# Patient Record
Sex: Female | Born: 1960 | Race: Black or African American | Hispanic: No | Marital: Married | Smoking: Never smoker
Health system: Southern US, Community
[De-identification: ages and names within clinical notes are randomized; demographics above are authoritative.]

## PROBLEM LIST (undated history)

## (undated) DIAGNOSIS — K219 Gastro-esophageal reflux disease without esophagitis: Secondary | ICD-10-CM

## (undated) DIAGNOSIS — M5030 Other cervical disc degeneration, unspecified cervical region: Secondary | ICD-10-CM

## (undated) DIAGNOSIS — R42 Dizziness and giddiness: Secondary | ICD-10-CM

## (undated) DIAGNOSIS — I10 Essential (primary) hypertension: Secondary | ICD-10-CM

## (undated) DIAGNOSIS — J309 Allergic rhinitis, unspecified: Secondary | ICD-10-CM

## (undated) DIAGNOSIS — J069 Acute upper respiratory infection, unspecified: Secondary | ICD-10-CM

## (undated) DIAGNOSIS — R7303 Prediabetes: Secondary | ICD-10-CM

## (undated) DIAGNOSIS — R319 Hematuria, unspecified: Secondary | ICD-10-CM

## (undated) DIAGNOSIS — I773 Arterial fibromuscular dysplasia: Secondary | ICD-10-CM

## (undated) DIAGNOSIS — R55 Syncope and collapse: Secondary | ICD-10-CM

## (undated) DIAGNOSIS — E049 Nontoxic goiter, unspecified: Secondary | ICD-10-CM

## (undated) DIAGNOSIS — H905 Unspecified sensorineural hearing loss: Secondary | ICD-10-CM

## (undated) HISTORY — DX: Other cervical disc degeneration, unspecified cervical region: M50.30

## (undated) HISTORY — DX: Essential (primary) hypertension: I10

## (undated) HISTORY — DX: Prediabetes: R73.03

## (undated) HISTORY — DX: Acute upper respiratory infection, unspecified: J06.9

## (undated) HISTORY — DX: Gastro-esophageal reflux disease without esophagitis: K21.9

## (undated) HISTORY — DX: Syncope and collapse: R55

## (undated) HISTORY — PX: CHOLECYSTECTOMY: SHX55

## (undated) HISTORY — DX: Allergic rhinitis, unspecified: J30.9

## (undated) HISTORY — DX: Unspecified sensorineural hearing loss: H90.5

## (undated) HISTORY — DX: Nontoxic goiter, unspecified: E04.9

## (undated) HISTORY — DX: Arterial fibromuscular dysplasia: I77.3

## (undated) HISTORY — DX: Hematuria, unspecified: R31.9

## (undated) HISTORY — DX: Dizziness and giddiness: R42

## (undated) HISTORY — PX: ESOPHAGOGASTRODUODENOSCOPY: SHX1529

---

## 2004-04-05 HISTORY — PX: TOTAL ABDOMINAL HYSTERECTOMY: SHX209

## 2004-05-08 ENCOUNTER — Ambulatory Visit: Payer: Self-pay | Admitting: Unknown Physician Specialty

## 2004-05-26 ENCOUNTER — Ambulatory Visit: Payer: Self-pay | Admitting: Unknown Physician Specialty

## 2004-08-25 ENCOUNTER — Ambulatory Visit: Payer: Self-pay | Admitting: Unknown Physician Specialty

## 2005-11-02 ENCOUNTER — Ambulatory Visit: Payer: Self-pay | Admitting: Unknown Physician Specialty

## 2006-03-07 ENCOUNTER — Emergency Department: Payer: Self-pay | Admitting: Emergency Medicine

## 2006-03-10 ENCOUNTER — Ambulatory Visit: Payer: Self-pay | Admitting: Family Medicine

## 2007-01-05 ENCOUNTER — Ambulatory Visit: Payer: Self-pay | Admitting: Unknown Physician Specialty

## 2008-01-16 ENCOUNTER — Ambulatory Visit: Payer: Self-pay | Admitting: Unknown Physician Specialty

## 2009-01-21 ENCOUNTER — Ambulatory Visit: Payer: Self-pay | Admitting: Unknown Physician Specialty

## 2009-03-18 ENCOUNTER — Ambulatory Visit: Payer: Self-pay | Admitting: Gastroenterology

## 2009-07-10 ENCOUNTER — Ambulatory Visit: Payer: Self-pay | Admitting: Unknown Physician Specialty

## 2010-01-05 ENCOUNTER — Ambulatory Visit: Payer: Self-pay | Admitting: Unknown Physician Specialty

## 2010-01-22 ENCOUNTER — Ambulatory Visit: Payer: Self-pay | Admitting: Unknown Physician Specialty

## 2010-11-26 ENCOUNTER — Ambulatory Visit: Payer: Self-pay | Admitting: Unknown Physician Specialty

## 2010-12-06 ENCOUNTER — Ambulatory Visit: Payer: Self-pay | Admitting: Unknown Physician Specialty

## 2011-02-03 ENCOUNTER — Ambulatory Visit: Payer: Self-pay | Admitting: Unknown Physician Specialty

## 2011-04-06 HISTORY — PX: COLONOSCOPY: SHX174

## 2011-05-13 ENCOUNTER — Emergency Department (HOSPITAL_COMMUNITY)
Admission: EM | Admit: 2011-05-13 | Discharge: 2011-05-13 | Disposition: A | Discharge status: Home / Self Care | Payer: 59 | Attending: Emergency Medicine | Admitting: Emergency Medicine

## 2011-05-13 ENCOUNTER — Encounter (HOSPITAL_COMMUNITY): Payer: Self-pay | Admitting: Emergency Medicine

## 2011-05-13 DIAGNOSIS — R42 Dizziness and giddiness: Secondary | ICD-10-CM | POA: Insufficient documentation

## 2011-05-13 DIAGNOSIS — R109 Unspecified abdominal pain: Secondary | ICD-10-CM | POA: Insufficient documentation

## 2011-05-13 NOTE — ED Notes (Signed)
Dizziness x 4 months states it is before bm that this happenes and then she feels better after bm has seen a dr for this  Has a gi dr last seen 2 weeks ago  Has f/u 2/14 and blood work ok  Has seen a neuro dr in [place] has had an EEg yesterday States still dizzy

## 2011-05-13 NOTE — ED Provider Notes (Signed)
Medical screening examination/treatment/procedure(s) were conducted as a shared visit with non-physician practitioner(s) and myself.  I personally evaluated the patient during the encounter   Benny Lennert, MD 05/13/11 1524

## 2011-05-13 NOTE — ED Notes (Signed)
Pt not in room presently  

## 2011-05-13 NOTE — ED Notes (Signed)
Patient discharged home, steady gait, denies pain. Verbalized understanding of instructions and followup

## 2011-05-13 NOTE — ED Notes (Signed)
Pt brought back to room 7

## 2011-05-13 NOTE — ED Provider Notes (Signed)
History     CSN: [id number]  Arrival date & time 05/13/11  1029   First MD Initiated Contact with Patient 05/13/11 1116      Chief Complaint  Patient presents with  . Dizziness    (Consider location/radiation/quality/duration/timing/severity/associated sxs/prior treatment) Patient is a 51 y.o. female presenting with cramps. The history is provided by the patient.  Abdominal Cramping The primary symptoms of the illness include abdominal pain. The primary symptoms of the illness do not include fever or vomiting.  Symptoms associated with the illness do not include chills. Associated symptoms comments: She reports symptoms x 4 months of lightheadedness associated with need for a bowel movement. When she feels cramping that she states feels like the need to have a bowel movement, she becomes lightheaded without syncope. She has been seen by her primary care doctor in [place]. She reports negative outpatient studies by gastroenterology and primary care including head CT, carotid dopplers, nerve conduction studies and an EEG that was performed yesterday but that she has not gotten the result for as yet. She is asymptomatic at this time. Marland Kitchen    History reviewed. No pertinent past medical history.  History reviewed. No pertinent past surgical history.  History reviewed. No pertinent family history.  History  Substance Use Topics  . Smoking status: Never Smoker   . Smokeless tobacco: Not on file  . Alcohol Use: No    OB History    Grav Para Term Preterm Abortions TAB SAB Ect Mult Living                  Review of Systems  Constitutional: Negative for fever and chills.  HENT: Negative.   Respiratory: Negative.   Cardiovascular: Negative.   Gastrointestinal: Positive for abdominal pain. Negative for vomiting and blood in stool.  Musculoskeletal: Negative.   Skin: Negative.   Neurological: Positive for light-headedness.    Allergies  Sulfa antibiotics  Home Medications    Current Outpatient Rx  Name Route Sig Dispense Refill  . ALLEGRA PO Oral Take 1 tablet by mouth daily as needed. For allergies    . PANTOPRAZOLE SODIUM 40 MG PO TBEC Oral Take 40 mg by mouth daily.      BP 143/74  Pulse 84  Temp(Src) 98.1 F (36.7 C) (Oral)  Resp 20  Ht 5\' 2"  (1.575 m)  Wt 140 lb (63.504 kg)  BMI 25.61 kg/m2  SpO2 100%  Physical Exam  Constitutional: She appears well-developed and well-nourished.  HENT:  Head: Normocephalic.  Neck: Normal range of motion. Neck supple.  Cardiovascular: Normal rate and regular rhythm.   Pulmonary/Chest: Effort normal and breath sounds normal.  Abdominal: Soft. Bowel sounds are normal. There is no tenderness. There is no rebound and no guarding.  Musculoskeletal: Normal range of motion.  Neurological: She is alert. No cranial nerve deficit.  Skin: Skin is warm and dry. No rash noted.  Psychiatric: She has a normal mood and affect.    ED Course  Procedures (including critical care time)  Labs Reviewed - No data to display No results found.   No diagnosis found.    MDM  Patient brings results of mutliple studies with her to ED. Discussed that, with her symptoms having resolved and outpatient follow up securely in place that further answers or diagnosis was unlikely in the emergency setting. Patient acknowledges understanding. All questions answered.        Rodena Medin, PA-C 05/13/11 1343

## 2011-05-15 ENCOUNTER — Emergency Department: Payer: Self-pay | Admitting: Emergency Medicine

## 2011-05-15 LAB — COMPREHENSIVE METABOLIC PANEL
Alkaline Phosphatase: 70 U/L (ref 50–136)
Calcium, Total: 9 mg/dL (ref 8.5–10.1)
Chloride: 109 mmol/L — ABNORMAL HIGH (ref 98–107)
Co2: 29 mmol/L (ref 21–32)
EGFR (African American): >60
EGFR (Non-African Amer.): >60
Osmolality: 294 (ref 275–301)
Sodium: 147 mmol/L — ABNORMAL HIGH (ref 136–145)

## 2011-05-15 LAB — CBC
MCH: 30.7 pg (ref 26.0–34.0)
MCHC: 33.4 g/dL (ref 32.0–36.0)
MCV: 92 fL (ref 80–100)
Platelet: 254 10*3/uL (ref 150–440)
RBC: 4.55 10*6/uL (ref 3.80–5.20)

## 2011-06-04 ENCOUNTER — Ambulatory Visit: Payer: Self-pay | Admitting: Internal Medicine

## 2011-06-24 DIAGNOSIS — R42 Dizziness and giddiness: Secondary | ICD-10-CM

## 2011-06-24 DIAGNOSIS — R1013 Epigastric pain: Secondary | ICD-10-CM | POA: Insufficient documentation

## 2011-06-24 HISTORY — DX: Dizziness and giddiness: R42

## 2011-07-05 DIAGNOSIS — J069 Acute upper respiratory infection, unspecified: Secondary | ICD-10-CM

## 2011-07-05 DIAGNOSIS — J309 Allergic rhinitis, unspecified: Secondary | ICD-10-CM | POA: Insufficient documentation

## 2011-07-05 HISTORY — DX: Acute upper respiratory infection, unspecified: J06.9

## 2011-07-05 HISTORY — DX: Allergic rhinitis, unspecified: J30.9

## 2011-08-16 ENCOUNTER — Ambulatory Visit: Payer: Self-pay | Admitting: Gastroenterology

## 2011-08-19 LAB — PATHOLOGY REPORT

## 2012-01-05 DIAGNOSIS — H905 Unspecified sensorineural hearing loss: Secondary | ICD-10-CM

## 2012-01-05 HISTORY — DX: Unspecified sensorineural hearing loss: H90.5

## 2012-04-05 HISTORY — PX: COLONOSCOPY: SHX174

## 2012-05-09 DIAGNOSIS — E559 Vitamin D deficiency, unspecified: Secondary | ICD-10-CM | POA: Insufficient documentation

## 2012-05-09 DIAGNOSIS — R7301 Impaired fasting glucose: Secondary | ICD-10-CM | POA: Insufficient documentation

## 2012-05-09 DIAGNOSIS — M5030 Other cervical disc degeneration, unspecified cervical region: Secondary | ICD-10-CM | POA: Insufficient documentation

## 2012-05-09 DIAGNOSIS — E785 Hyperlipidemia, unspecified: Secondary | ICD-10-CM | POA: Insufficient documentation

## 2012-05-09 HISTORY — DX: Other cervical disc degeneration, unspecified cervical region: M50.30

## 2012-05-22 ENCOUNTER — Encounter (HOSPITAL_COMMUNITY): Payer: Self-pay | Admitting: Emergency Medicine

## 2012-05-22 ENCOUNTER — Emergency Department (HOSPITAL_COMMUNITY)
Admission: EM | Admit: 2012-05-22 | Discharge: 2012-05-22 | Disposition: A | Discharge status: Home / Self Care | Payer: 59 | Attending: Emergency Medicine | Admitting: Emergency Medicine

## 2012-05-22 ENCOUNTER — Emergency Department (INDEPENDENT_AMBULATORY_CARE_PROVIDER_SITE_OTHER)
Admission: EM | Admit: 2012-05-22 | Discharge: 2012-05-22 | Disposition: A | Discharge status: Nursing Home (Includes ICF) | Payer: 59 | Source: Home / Self Care | Attending: Emergency Medicine | Admitting: Emergency Medicine

## 2012-05-22 DIAGNOSIS — Z79899 Other long term (current) drug therapy: Secondary | ICD-10-CM | POA: Insufficient documentation

## 2012-05-22 DIAGNOSIS — R55 Syncope and collapse: Secondary | ICD-10-CM | POA: Insufficient documentation

## 2012-05-22 DIAGNOSIS — R42 Dizziness and giddiness: Secondary | ICD-10-CM

## 2012-05-22 DIAGNOSIS — R209 Unspecified disturbances of skin sensation: Secondary | ICD-10-CM | POA: Insufficient documentation

## 2012-05-22 LAB — COMPREHENSIVE METABOLIC PANEL
ALT: 9 U/L (ref 0–35)
Albumin: 3.7 g/dL (ref 3.5–5.2)
Alkaline Phosphatase: 78 U/L (ref 39–117)
Chloride: 106 mEq/L (ref 96–112)
Glucose, Bld: 120 mg/dL — ABNORMAL HIGH (ref 70–99)
Potassium: 3.6 mEq/L (ref 3.5–5.1)
Sodium: 143 mEq/L (ref 135–145)
Total Protein: 6.8 g/dL (ref 6.0–8.3)

## 2012-05-22 LAB — CBC WITH DIFFERENTIAL/PLATELET
Basophils Relative: 0 % (ref 0–1)
Eosinophils Absolute: 0.1 10*3/uL (ref 0.0–0.7)
Lymphs Abs: 1.9 10*3/uL (ref 0.7–4.0)
MCH: 30.6 pg (ref 26.0–34.0)
Neutro Abs: 4.8 10*3/uL (ref 1.7–7.7)
Neutrophils Relative %: 66 % (ref 43–77)
Platelets: 244 10*3/uL (ref 150–400)
RBC: 4.48 MIL/uL (ref 3.87–5.11)

## 2012-05-22 MED ORDER — SODIUM CHLORIDE 0.9 % IV BOLUS (SEPSIS): 1000.0000 mL | Freq: Once | INTRAVENOUS | Status: DC

## 2012-05-22 NOTE — ED Notes (Signed)
Dizziness started 1 year getting worse since Thursday and saw ucc today and was sent for further tests

## 2012-05-22 NOTE — ED Provider Notes (Signed)
Medical screening examination/treatment/procedure(s) were performed by non-physician practitioner and as supervising physician I was immediately available for consultation/collaboration.  Ollin Hochmuth, M.D.  Alexzavier Girardin C Valor Quaintance, MD 05/22/12 2139 

## 2012-05-22 NOTE — ED Provider Notes (Signed)
History     CSN: [id number]  Arrival date & time 05/22/12  1027   First MD Initiated Contact with Patient 05/22/12 1053      Chief Complaint  Patient presents with  . Dizziness    (Consider location/radiation/quality/duration/timing/severity/associated sxs/prior treatment) Patient is a 52 y.o. female presenting with weakness. The history is provided by the patient. No language interpreter was used.  Weakness This is a new problem. The current episode started 3 to 5 hours ago. The problem occurs constantly. Nothing aggravates the symptoms. Nothing relieves the symptoms. She has tried nothing for the symptoms.  Pt reports she has been diagnosed with vasovagal syncope.   Pt had an episode today.   Pt reports she has tingling in her nose, face and feels weak in her right arm and right leg.  Pt reports multiple similar episodes but they are becoming worse.   Pt is followed by [facility] neurology.    History reviewed. No pertinent past medical history.  History reviewed. No pertinent past surgical history.  No family history on file.  History  Substance Use Topics  . Smoking status: Never Smoker   . Smokeless tobacco: Not on file  . Alcohol Use: No    OB History   Grav Para Term Preterm Abortions TAB SAB Ect Mult Living                  Review of Systems  Neurological: Positive for weakness.  All other systems reviewed and are negative.    Allergies  Sulfa antibiotics  Home Medications   Current Outpatient Rx  Name  Route  Sig  Dispense  Refill  . pantoprazole (PROTONIX) 40 MG tablet   Oral   Take 40 mg by mouth daily.         Marland Kitchen Fexofenadine HCl (ALLEGRA PO)   Oral   Take 1 tablet by mouth daily as needed. For allergies           BP 130/77  Pulse 62  Temp(Src) 98.4 F (36.9 C) (Oral)  Resp 20  SpO2 99%  Physical Exam  Nursing note and vitals reviewed. Constitutional: She is oriented to person, place, and time. She appears well-developed and  well-nourished.  HENT:  Head: Normocephalic and atraumatic.  Right Ear: External ear normal.  Left Ear: External ear normal.  Mouth/Throat: Oropharynx is clear and moist.  Eyes: Conjunctivae and EOM are normal. Pupils are equal, round, and reactive to light.  Neck: Normal range of motion. Neck supple.  Cardiovascular: Normal rate.   Pulmonary/Chest: Effort normal.  Abdominal: Soft.  Musculoskeletal: Normal range of motion.  Neurological: She is alert and oriented to person, place, and time. A cranial nerve deficit is present. Coordination normal.  Skin: Skin is warm.  Psychiatric: She has a normal mood and affect.    ED Course  Procedures (including critical care time)  Labs Reviewed - No data to display No results found.   No diagnosis found.    MDM  Pt to ED for evaluation.       Lonia Skinner Pole [place], [place] 05/22/12 1144

## 2012-05-22 NOTE — ED Notes (Signed)
Pt refused IV and fluid because she thinks it's not necessary.

## 2012-05-22 NOTE — ED Notes (Signed)
Pt c/o dizziness x1 year and c/o "blacking out" last Thursday Saw her neurologist on 04/07/12 and has a f/u appt this Friday Reports shoveling snow last Thursday and started to "black out" Sx included: fatigue and feeling weakness; slurry speech, tingly sensation on cheeks; episode lasted for 40 minutes Neurologist dx her w/vasovagul States that sx are progressively getting worse Also states that while driving to work today, started to black out.   She is alert w/no signs of acute distress.

## 2012-05-22 NOTE — ED Provider Notes (Signed)
History     CSN: [id number]  Arrival date & time 05/22/12  1206   First MD Initiated Contact with Patient 05/22/12 1244      Chief Complaint  Patient presents with  . Dizziness    (Consider location/radiation/quality/duration/timing/severity/associated sxs/prior treatment) The history is provided by the patient.  Karen Maldonado is a 52 y.o. female history of vasovagal syncope here with presyncope. She was driving this morning and felt like she was going to pass out but didn't pass out. Denies any antecedent chest pain or shortness of breath. She has been follow back at [facility] neuro clinic and had extensive work up including nl EEG, video EEG, stress test, echo. She was diagnosed with vasovagal syncope. Went to urgent care today and sent for eval. She has some tingling in her R arm and leg that is chronic. No new weakness.    History reviewed. No pertinent past medical history.  History reviewed. No pertinent past surgical history.  No family history on file.  History  Substance Use Topics  . Smoking status: Never Smoker   . Smokeless tobacco: Not on file  . Alcohol Use: No    OB History   Grav Para Term Preterm Abortions TAB SAB Ect Mult Living                  Review of Systems  Neurological: Positive for dizziness.  All other systems reviewed and are negative.    Allergies  Sulfa antibiotics  Home Medications   Current Outpatient Rx  Name  Route  Sig  Dispense  Refill  . meclizine (ANTIVERT) 25 MG tablet   Oral   Take 25 mg by mouth 3 (three) times daily as needed for dizziness.         . pantoprazole (PROTONIX) 40 MG tablet   Oral   Take 40 mg by mouth daily.         Marland Kitchen Fexofenadine HCl (ALLEGRA PO)   Oral   Take 1 tablet by mouth daily as needed. For allergies           BP 147/85  Pulse 66  Temp(Src) 98.3 F (36.8 C)  Resp 16  SpO2 100%  Physical Exam  Nursing note and vitals reviewed. Constitutional: She is oriented to  person, place, and time. She appears well-developed and well-nourished.  HENT:  Head: Normocephalic.  Mouth/Throat: Oropharynx is clear and moist.  Eyes: Conjunctivae are normal. Pupils are equal, round, and reactive to light.  Neck: Normal range of motion. Neck supple.  Cardiovascular: Normal rate, regular rhythm and normal heart sounds.   Pulmonary/Chest: Effort normal and breath sounds normal. No respiratory distress. She has no wheezes. She has no rales.  Abdominal: Soft. Bowel sounds are normal. She exhibits no distension. There is no tenderness. There is no rebound.  Musculoskeletal: Normal range of motion.  Neurological: She is alert and oriented to person, place, and time.  Nl strength and sensation throughout. Nl gait. No pronator drift.   Skin: Skin is warm and dry.  Psychiatric: She has a normal mood and affect. Her behavior is normal. Judgment and thought content normal.    ED Course  Procedures (including critical care time)  Labs Reviewed  COMPREHENSIVE METABOLIC PANEL - Abnormal; Notable for the following:    Glucose, Bld 120 (*)    Total Bilirubin 0.2 (*)    All other components within normal limits  CBC WITH DIFFERENTIAL  TROPONIN I   No results found.  No diagnosis found.   Date: 05/22/2012  Rate: 71  Rhythm: normal sinus rhythm  QRS Axis: normal  Intervals: PR shortened  ST/T Wave abnormalities: normal  Conduction Disutrbances:none  Narrative Interpretation:   Old EKG Reviewed: none available    MDM  Karen Maldonado is a 52 y.o. female here with presyncope. I doubt stroke or seizure or acs. I think she likely has vasovagal syncope again. She is not orthostatic. Will check basic labs. She can f/u with her neurologist.   2:44 PM Labs nl. Nonfocal neuro exam so she doesn't need CT head. Likely vasovagal syncope. Will d/c home and she can f/u with her neurologist.         Richardean Canal, MD 05/22/12 1445

## 2012-07-02 ENCOUNTER — Emergency Department: Payer: Self-pay | Admitting: Emergency Medicine

## 2012-07-02 LAB — CBC
HGB: 14.2 g/dL (ref 12.0–16.0)
MCH: 31.3 pg (ref 26.0–34.0)
MCV: 90 fL (ref 80–100)
Platelet: 251 10*3/uL (ref 150–440)
RDW: 13.5 % (ref 11.5–14.5)
WBC: 9.4 10*3/uL (ref 3.6–11.0)

## 2012-07-02 LAB — URINALYSIS, COMPLETE
Nitrite: NEGATIVE
Ph: 6 (ref 4.5–8.0)
Protein: NEGATIVE
RBC,UR: 3 /HPF (ref 0–5)

## 2012-07-02 LAB — COMPREHENSIVE METABOLIC PANEL
Alkaline Phosphatase: 103 U/L (ref 50–136)
Anion Gap: 2 — ABNORMAL LOW (ref 7–16)
Co2: 32 mmol/L (ref 21–32)
Creatinine: 0.73 mg/dL (ref 0.60–1.30)
EGFR (African American): >60
Glucose: 140 mg/dL — ABNORMAL HIGH (ref 65–99)
Osmolality: 285 (ref 275–301)
SGOT(AST): 20 U/L (ref 15–37)
SGPT (ALT): 19 U/L (ref 12–78)
Total Protein: 7.6 g/dL (ref 6.4–8.2)

## 2012-07-02 LAB — TROPONIN I: Troponin-I: <0.02 ng/mL

## 2012-08-18 ENCOUNTER — Ambulatory Visit: Payer: Self-pay | Admitting: Neurology

## 2012-08-25 DIAGNOSIS — R609 Edema, unspecified: Secondary | ICD-10-CM | POA: Insufficient documentation

## 2012-11-03 ENCOUNTER — Ambulatory Visit: Payer: Self-pay

## 2013-01-12 ENCOUNTER — Ambulatory Visit: Payer: Self-pay | Admitting: Gastroenterology

## 2013-03-02 ENCOUNTER — Ambulatory Visit: Payer: Self-pay | Admitting: Internal Medicine

## 2013-09-12 DIAGNOSIS — I10 Essential (primary) hypertension: Secondary | ICD-10-CM | POA: Insufficient documentation

## 2013-11-02 DIAGNOSIS — R55 Syncope and collapse: Secondary | ICD-10-CM

## 2013-11-02 HISTORY — DX: Syncope and collapse: R55

## 2013-11-19 DIAGNOSIS — K219 Gastro-esophageal reflux disease without esophagitis: Secondary | ICD-10-CM | POA: Insufficient documentation

## 2013-11-19 HISTORY — DX: Gastro-esophageal reflux disease without esophagitis: K21.9

## 2014-01-24 ENCOUNTER — Ambulatory Visit: Payer: 59 | Attending: Neurology | Admitting: Rehabilitative and Restorative Service Providers"

## 2014-01-24 DIAGNOSIS — R42 Dizziness and giddiness: Secondary | ICD-10-CM | POA: Diagnosis present

## 2014-01-24 DIAGNOSIS — R269 Unspecified abnormalities of gait and mobility: Secondary | ICD-10-CM | POA: Diagnosis not present

## 2014-01-29 ENCOUNTER — Ambulatory Visit: Payer: 59 | Admitting: Rehabilitative and Restorative Service Providers"

## 2014-01-29 DIAGNOSIS — R42 Dizziness and giddiness: Secondary | ICD-10-CM | POA: Diagnosis not present

## 2014-02-04 DIAGNOSIS — I10 Essential (primary) hypertension: Secondary | ICD-10-CM

## 2014-02-04 HISTORY — DX: Essential (primary) hypertension: I10

## 2014-02-12 ENCOUNTER — Ambulatory Visit: Payer: 59 | Attending: Neurology | Admitting: Physical Therapy

## 2014-02-12 DIAGNOSIS — R42 Dizziness and giddiness: Secondary | ICD-10-CM | POA: Insufficient documentation

## 2014-02-12 DIAGNOSIS — R269 Unspecified abnormalities of gait and mobility: Secondary | ICD-10-CM | POA: Insufficient documentation

## 2014-02-19 ENCOUNTER — Encounter: Payer: Self-pay | Admitting: Physical Therapy

## 2014-02-19 ENCOUNTER — Ambulatory Visit: Payer: 59 | Admitting: Physical Therapy

## 2014-02-19 DIAGNOSIS — R269 Unspecified abnormalities of gait and mobility: Secondary | ICD-10-CM | POA: Diagnosis not present

## 2014-02-19 DIAGNOSIS — R42 Dizziness and giddiness: Secondary | ICD-10-CM

## 2014-02-19 NOTE — Therapy (Signed)
Physical Therapy Treatment  Patient Details  Name: Karen Maldonado MRN: [id number] Date of Birth: 1960/06/21  Encounter Date: 02/19/2014      PT End of Session - 02/19/14 1700    Visit Number 3   Number of Visits 5   Date for PT Re-Evaluation 02/23/14   PT Start Time 1536   PT Stop Time 1615   PT Time Calculation (min) 39 min      History reviewed. No pertinent past medical history.  History reviewed. No pertinent past surgical history.  There were no vitals taken for this visit.  Visit Diagnosis:  Dizziness and giddiness      Subjective Assessment - 02/19/14 1540    Symptoms Pt. reports feeling much weaker - felt feeling faint Halloween night - MD ordered 24 hour cardiac monitor   Currently in Pain? No/denies            [facility] Adult PT Treatment/Exercise - 02/19/14 1653    Ambulation/Gait   Ambulation/Gait Yes   Ambulation Distance (Feet) 120 Feet  making circles with ball for visual/vestibular function   High Level Balance   High Level Balance Comments Pt. performed vestibular exercises including standing on foam with EO and EC with horizontal and vertical head turns with CGA;  marching on incline and on decline with head turns with CGA:  trunk rotations standing on foam in corner x 10 reps with CGA; standing on incline visually tracking balll clockwise and counterclockwise with CGA;  bending 1/2 down to floor while standing on foam x 5 rpes with CGA;    standing with feet apart and feet together on foam - targets          PT Education - 02/19/14 1700    Education provided Yes   Education Details added standing on foam to HEP with EO and EC   Person(s) Educated Patient   Methods Explanation;Demonstration;Handout   Comprehension Verbalized understanding;Returned demonstration     Discussed benefits of adding walking program to HEP for strengthening of legs and to address balance/vestibular deficits - pt. Verbalized understanding     PT Short Term  Goals - 02/19/14 1705    PT SHORT TERM GOAL #1   Title same as LTG's          PT Long Term Goals - 02/19/14 1705    PT LONG TERM GOAL #1   Title Independent in HEP for motion sensitivity, x1 viewing, balance & mobility.   Baseline 02-23-14   Time 4   Period Weeks   Status On-going   PT LONG TERM GOAL #2   Title improve DGI to >/= 20/24   Baseline 02-23-14   Time 4   Period Weeks   Status On-going   PT LONG TERM GOAL #3   Title Improve DHI from 50% to 35% to demo improved subjective report of dizziness   Baseline 02-23-14   Time 4   Period Weeks   Status On-going          Plan - 02/19/14 1701    Clinical Impression Statement some of pt.'s symptoms do not appear to be related to vestibular dysfunction, i.e feeling faint and feeling very weak in legs; other symptoms such as dysequilibrium appear to be vestibular related   Pt will benefit from skilled therapeutic intervention in order to improve on the following deficits Other (comment);Difficulty walking;Decreased strength;Decreased balance;Decreased activity tolerance  vestibular dysfunction; vertigo/dizziness   Rehab Potential Good   PT Frequency 1x / week  PT Duration 4 weeks   PT Treatment/Interventions Therapeutic activities;Patient/family education;Therapeutic exercise;Gait training;Balance training;Neuromuscular re-education;Stair training;Functional mobility training;Other (comment)  vestibular rehab   PT Next Visit Plan continue vestibular exercises   PT Home Exercise Plan added standing on compliant surface with EO/EC   Consulted and Agree with Plan of Care Patient        Problem List There are no active problems to display for this patient.                                          Kerry FortSuzanne Zayde Maldonado, PT [facility] [address] [place], [place] [address] [phone]     Kary KosDilday, Jaece Ducharme Karen 02/19/2014, 5:15 PM

## 2014-02-19 NOTE — Patient Instructions (Addendum)
Feet Together (Compliant Surface) Varied Arm Positions - Eyes Open   With eyes open, standing on compliant surface: ________, feet together and arms out, look at a stationary object. Hold ____ seconds. Repeat ____ times per session. Do ____ sessions per day.  Copyright  VHI. All rights reserved.  Feet Apart (Compliant Surface) Varied Arm Positions - Eyes Open   With eyes open, standing on compliant surface: ________, feet shoulder width apart and arms out, look at a stationary object. Hold ____ seconds. Repeat ____ times per session. Do ____ sessions per day.  Copyright  VHI. All rights reserved.  Feet Apart (Compliant Surface) Varied Arm Positions - Eyes Open   With eyes open, standing on compliant surface: ________, feet shoulder width apart and arms out, look at a stationary object. Hold ____ seconds. Repeat ____ times per session. Do ____ sessions per day.  Copyright  VHI. All rights reserved.  Feet Apart (Compliant Surface) Varied Arm Positions - Eyes Open   With eyes open, standing on compliant surface: ________, feet shoulder width apart and arms out, look at a stationary object. Hold ____ seconds. Repeat ____ times per session. Do ____ sessions per day.  Copyright  VHI. All rights reserved.  Feet Apart (Compliant Surface) Varied Arm Positions - Eyes Open   With eyes open, standing on compliant surface: __pilllow______, feet shoulder width apart and arms out, look at a stationary object. Hold __30__ seconds. Repeat __1-2__ times per session. Do __1-2__ sessions per day.  Copyright  VHI. All rights reserved.  Also do standing with feet together and add head turns as tolerable. Stand with eyes closed on pillow - feet apart and feet together - same exercise as above

## 2014-02-26 ENCOUNTER — Encounter: Payer: Self-pay | Admitting: Rehabilitative and Restorative Service Providers"

## 2014-02-26 ENCOUNTER — Ambulatory Visit: Payer: 59 | Admitting: Rehabilitative and Restorative Service Providers"

## 2014-02-26 DIAGNOSIS — R42 Dizziness and giddiness: Secondary | ICD-10-CM | POA: Diagnosis not present

## 2014-02-26 NOTE — Therapy (Signed)
Physical Therapy Treatment and Discharge Summary 02/26/14  Patient Details  Name: Karen Maldonado MRN: [id number] Date of Birth: [birth year]  Encounter Date: 02/26/2014      PT End of Session - 02/26/14 0852    Visit Number 4   Number of Visits 5   Date for PT Re-Evaluation 02/23/14   PT Start Time 0850   PT Stop Time 0935   PT Time Calculation (min) 45 min   Activity Tolerance Patient tolerated treatment well      History reviewed. No pertinent past medical history.  History reviewed. No pertinent past surgical history.  There were no vitals taken for this visit.  Visit Diagnosis:  Dizziness and giddiness      Subjective Assessment - 02/26/14 0853    Symptoms The patient reports cardiac monitor ordered and "fluttering" noted, however on current medications.  Pt reports she cannot stand for long periods of time because of feeling like she may blackout.  "I just have to keep pumping my legs".  She reports she has readabout vasovagal syncope and feels that if she does activities like pump her legs, it helps.  These symptoms are worsening and she feels she experiences this every day.  The patient                                                                                  Currently in Pain? No/denies          [facility] PT Assessment - 02/26/14 0914    Standardized Balance Assessment   Standardized Balance Assessment Balance Master Testing;Dynamic Gait Index   Balance Master Testing Sensory Organization Test;Other/comments   Dynamic Gait Index   Level Surface Normal   Change in Gait Speed Normal   Gait with Horizontal Head Turns Normal   Gait with Vertical Head Turns Normal   Gait and Pivot Turn Mild Impairment   Step Over Obstacle Normal   Step Around Obstacles Normal   Steps Mild Impairment   Total Score 22   Balance Master Testing    Results 54% compared to age/height normative values of 70-72%.  Pt with WNLs use of somatosensory feedback, mild decrease in  use of visual feedback and moderate decrease in use of vestibular inputs for balance.  Pt reports nausea and fatigue with testing     NEUROMUSCULAR RE-EDUCATION: Reviewed all HEP including: Foam EO/EC x 30 seconds, Gaze x 1 viewing, head/eyes coordination, and tandem stance. Recommended the patient continue HEP as she is demonstrating improvement in SOT score from 41% up to 54% and improved DGI from 17/24 up to 22/24.   FOTO=58% functional status survey and 68% DHI now rated "severe" categorization.  Gait: Pt performed DGI.     PT Education - 02/26/14 1126    Education provided Yes   Education Details Recommended patient continue current HEP and f/u as needed for pre-syncopal sensation she feels is worsening.   Person(s) Educated Patient   Methods Explanation   Comprehension Verbalized understanding            PT Long Term Goals - 02/26/14 1129    PT LONG TERM GOAL #1   Title Independent in HEP for motion  sensitivity, x1 viewing, balance & mobility.   Baseline Pt met goal and has HEP for post d/c program.   Time 4   Period Weeks   Status Achieved   PT LONG TERM GOAL #2   Title improve DGI to >/= 20/24   Baseline Goal met.  Pt improved from 17/24 up to 20/24.   Time 4   Period Weeks   Status Achieved   PT LONG TERM GOAL #3   Title Improve DHI from 50% to 35% to demo improved subjective report of dizziness   Baseline Goal not met.  Pt feels "lightheadedness" with standing >15 minutes worsening and her [place] worsened fro 50% up to 68% for subjective report of dizziness.   Time 4   Period Weeks   Status Not Met          Plan - 02/26/14 1126    Clinical Impression Statement The patient's current HEP is still appropriate and PT recommended she continue after discharge.  She has met 2/3 LTGs.  She did not meet LTG for DHI decreasing and actually had an increase from 50% up to 68% in severity of subjective rating for dizziness.  Balance master, and DGI demonstrated  improvements per objective testing.                                                               PT Next Visit Plan d/c today.   PT Home Exercise Plan continue current HEP.   Recommended Other Services Pt already established with cardiology and neurology.   Consulted and Agree with Plan of Care Patient        Problem List There are no active problems to display for this patient.  PHYSICAL THERAPY DISCHARGE SUMMARY  Visits from Start of Care: 4   Current functional level related to goals / functional outcomes: See above goals for functional status.     Remaining deficits: Patient feels balance and vestibular training has improved certain aspects of her clinical presentation, however she feels other components are worsening.  She reports sensation of ligthheadedness with prolonged standing is progressing and she wore a cardiac monitor x 24 hours.  She reports cardiologist reviewed results with her.   Education / Equipment: HEP with recommendations to continue post d/c due to improvement in dynamic gait index and sensory organization testing.  Plan: Patient agrees to discharge.  Patient goals were partially met. Patient is being discharged due to meeting the stated rehab goals.  ??Thank you for the referral of this patient. ??                                      Rudell Cobb, PT, MPT 02/26/2014 11:36 AM [facility] Phone: [phone] Fax: [phone]   [place] 02/26/2014, 11:33 AM

## 2014-05-30 ENCOUNTER — Emergency Department: Payer: Self-pay | Admitting: Internal Medicine

## 2014-07-17 DIAGNOSIS — R079 Chest pain, unspecified: Secondary | ICD-10-CM | POA: Insufficient documentation

## 2014-11-16 DIAGNOSIS — R635 Abnormal weight gain: Secondary | ICD-10-CM | POA: Insufficient documentation

## 2014-11-16 DIAGNOSIS — I10 Essential (primary) hypertension: Secondary | ICD-10-CM | POA: Insufficient documentation

## 2014-12-05 ENCOUNTER — Encounter: Payer: Self-pay | Admitting: *Deleted

## 2014-12-05 ENCOUNTER — Other Ambulatory Visit: Payer: Self-pay | Admitting: *Deleted

## 2014-12-19 ENCOUNTER — Ambulatory Visit (INDEPENDENT_AMBULATORY_CARE_PROVIDER_SITE_OTHER): Payer: 59 | Admitting: Urology

## 2014-12-19 ENCOUNTER — Encounter: Payer: Self-pay | Admitting: Urology

## 2014-12-19 VITALS — BP 136/80 | HR 61 | Ht 62.0 in | Wt 158.7 lb

## 2014-12-19 DIAGNOSIS — N952 Postmenopausal atrophic vaginitis: Secondary | ICD-10-CM | POA: Insufficient documentation

## 2014-12-19 DIAGNOSIS — R312 Other microscopic hematuria: Secondary | ICD-10-CM | POA: Diagnosis not present

## 2014-12-19 DIAGNOSIS — R3129 Other microscopic hematuria: Secondary | ICD-10-CM | POA: Insufficient documentation

## 2014-12-19 DIAGNOSIS — K589 Irritable bowel syndrome without diarrhea: Secondary | ICD-10-CM | POA: Insufficient documentation

## 2014-12-19 LAB — MICROSCOPIC EXAMINATION

## 2014-12-19 LAB — URINALYSIS, COMPLETE
BILIRUBIN UA: NEGATIVE
Glucose, UA: NEGATIVE
KETONES UA: NEGATIVE
Nitrite, UA: NEGATIVE
PROTEIN UA: NEGATIVE
SPEC GRAV UA: 1.015 (ref 1.005–1.030)
Urobilinogen, Ur: 0.2 mg/dL (ref 0.2–1.0)
pH, UA: 7 (ref 5.0–7.5)

## 2014-12-19 MED ORDER — ESTRADIOL 0.1 MG/GM VA CREA: TOPICAL_CREAM | VAGINAL | Status: DC

## 2014-12-19 NOTE — Progress Notes (Signed)
12/19/2014 11:37 AM   Karen Maldonado 1960-10-09 [id number]  Referring provider: Jennefer Bravo, MD [address]   [place], [place] [address]  Chief Complaint  Patient presents with  . Hematuria    microscopic hematuria    HPI: Patient is a 54 year old African American female who has a history of microscopic hematuria who presents today for a yearly follow up.  Patient underwent a hematuria evaluation in 2014 with a CT Urogram and cystoscopy with Dr. Edwyna Shell.  No GU pathology was identified.    Today, she has not experienced any gross hematuria, dysuria or suprapubic pain. She also denies any recent fevers, chills, nausea or vomiting.  She is experiencing vaginal irritation and dryness. She is also having frictional pain with intercourse.  She is not having any vaginal discharge or bulging in the vaginal vault.    Her UA today is negative for microscopic hematuria.  PMH: Past Medical History  Diagnosis Date  . Hematuria     microscopic  . GERD (gastroesophageal reflux disease)   . Benign essential HTN 11/16/2014  . Essential (primary) hypertension 02/04/2014  . BP (high blood pressure) 09/12/2013  . Near syncope 11/02/2013  . Allergic rhinitis 07/05/2011  . Infection of the upper respiratory tract 07/05/2011  . Gastro-esophageal reflux disease without esophagitis 11/19/2013  . Deafness, sensorineural 01/05/2012  . DDD (degenerative disc disease), cervical 05/09/2012  . Dizziness 06/24/2011    Overview:  negative ENT work-up Negative cardiac work-up including Holter, ECHO and stress test     Surgical History: Past Surgical History  Procedure Laterality Date  . Total abdominal hysterectomy    . Cholecystectomy      Home Medications:    Medication List       This list is accurate as of: 12/19/14 11:37 AM.  Always use your most recent med list.               ALLEGRA PO  Take 1 tablet by mouth daily as needed. For allergies     aspirin EC 81  MG tablet  Take by mouth.     cyclobenzaprine 5 MG tablet  Commonly known as:  FLEXERIL  Take by mouth.     D 2000 2000 UNITS Tabs  Generic drug:  Cholecalciferol  Take by mouth.     estradiol 0.1 MG/GM vaginal cream  Commonly known as:  ESTRACE  Patient is instructed to apply 0.5mg  (pea-sized amount)  just inside the vaginal introitus with a finger-tip every night for two weeks and then Monday, Wednesday and Friday nights.     fluticasone 50 MCG/ACT nasal spray  Commonly known as:  FLONASE  Place into the nose.     hydrochlorothiazide 12.5 MG tablet  Commonly known as:  HYDRODIURIL  Take by mouth.     hydrochlorothiazide 12.5 MG tablet  Commonly known as:  HYDRODIURIL  TAKE 1 TABLET (12.5 MG TOTAL) BY MOUTH ONCE DAILY.     meclizine 25 MG tablet  Commonly known as:  ANTIVERT  Take 25 mg by mouth 3 (three) times daily as needed for dizziness.     metoprolol succinate 25 MG 24 hr tablet  Commonly known as:  TOPROL-XL  TAKE 1 TABLET BY MOUTH ONCE A DAY     pantoprazole 40 MG tablet  Commonly known as:  PROTONIX  Take by mouth.        Allergies:  Allergies  Allergen Reactions  . Penicillin G Hives  . Sulfa Antibiotics Nausea  And Vomiting and Rash    Family History: Family History  Problem Relation Age of Onset  . Stroke Father   . Hypertension Father   . Hypertension Mother     Social History:  reports that she has never smoked. She does not have any smokeless tobacco history on file. She reports that she does not drink alcohol or use illicit drugs.  ROS: UROLOGY Frequent Urination?: No Hard to postpone urination?: No Burning/pain with urination?: No Get up at night to urinate?: No Leakage of urine?: No Urine stream starts and stops?: No Trouble starting stream?: No Do you have to strain to urinate?: No Blood in urine?: No Urinary tract infection?: No Sexually transmitted disease?: No Injury to kidneys or bladder?: No Painful intercourse?:  No Weak stream?: No Currently pregnant?: No Vaginal bleeding?: No Last menstrual period?: No  Gastrointestinal Nausea?: No Vomiting?: No Indigestion/heartburn?: No Diarrhea?: No Constipation?: No  Constitutional Fever: No Night sweats?: No Weight loss?: No Fatigue?: No  Skin Skin rash/lesions?: No Itching?: No  Eyes Blurred vision?: No Double vision?: No  Ears/Nose/Throat Sore throat?: No Sinus problems?: No  Hematologic/Lymphatic Swollen glands?: No Easy bruising?: No  Cardiovascular Leg swelling?: Yes Chest pain?: No  Respiratory Cough?: No Shortness of breath?: No  Endocrine Excessive thirst?: No  Musculoskeletal Back pain?: Yes Joint pain?: No  Neurological Headaches?: No Dizziness?: Yes  Psychologic Depression?: No Anxiety?: No  Physical Exam: BP 136/80 mmHg  Pulse 61  Ht 5\' 2"  (1.575 m)  Wt 158 lb 11.2 oz (71.986 kg)  BMI 29.02 kg/m2  GU:  Atrophic external genitalia.  Normal urethral meatus. No urethral masses and/or tenderness. No bladder fullness or masses. No vaginal lesions or discharge. Normal rectal tone, no masses. Normal anus and perineum.   Laboratory Data: Lab Results  Component Value Date   WBC 9.4 07/02/2012   HGB 14.2 07/02/2012   HCT 40.8 07/02/2012   MCV 90 07/02/2012   PLT 251 07/02/2012    Lab Results  Component Value Date   CREATININE 0.73 07/02/2012   Urinalysis Results for orders placed or performed in visit on 12/19/14  Microscopic Examination  Result Value Ref Range   WBC, UA 6-10 (A) 0 -  5 /hpf   RBC, UA 0-2 0 -  2 /hpf   Epithelial Cells (non renal) 0-10 0 - 10 /hpf   Mucus, UA Present (A) Not Estab.   Bacteria, UA Moderate (A) None seen/Few  Urinalysis, Complete  Result Value Ref Range   Specific Gravity, UA 1.015 1.005 - 1.030   pH, UA 7.0 5.0 - 7.5   Color, UA Yellow Yellow   Appearance Ur Clear Clear   Leukocytes, UA 1+ (A) Negative   Protein, UA Negative Negative/Trace   Glucose, UA  Negative Negative   Ketones, UA Negative Negative   RBC, UA 1+ (A) Negative   Bilirubin, UA Negative Negative   Urobilinogen, Ur 0.2 0.2 - 1.0 mg/dL   Nitrite, UA Negative Negative   Microscopic Examination See below:      Assessment & Plan:    1. Microscopic hematuria:   Patient completed a hematuria workup in 2014. No GU pathology was discovered. She has not had any recent gross hematuria. She did not have microscopic hematuria on today's exam. We will continue to monitor with yearly UA's.  He will contact us if she experiences any gross hematuria in the interim.  - Urinalysis, Complete  2. Atrophic vaginitis:   Patient was given a sample of  vaginal estrogen cream and instructed to apply 0.5mg  (pea-sized amount)  just inside the vaginal introitus with a finger-tip every night for two weeks and then Monday, Wednesday and Friday nights.  I explained to the patient that vaginally administered estrogen, which causes only a slight increase in the blood estrogen levels, have fewer contraindications and adverse systemic effects that oral HT.   Patient will RTC in 2 weeks for vaginal exam.     Return in about 2 weeks (around 01/02/2015) for exam.  Michiel Cowboy, [facility]  [facility] [address] [place], [place] [address] [phone]

## 2014-12-24 ENCOUNTER — Telehealth: Payer: Self-pay

## 2014-12-24 ENCOUNTER — Encounter: Payer: Self-pay | Admitting: Urology

## 2014-12-24 NOTE — Telephone Encounter (Signed)
Yes.  You need to refrain from sex on the nights you use the cream.

## 2014-12-24 NOTE — Telephone Encounter (Signed)
In using the estrogen cream, should I re frame from having intercourse while using the medication. I fail to ask the question during my recent visit. Thank you for responding.  This is a my chart message. Please advise.

## 2015-01-02 ENCOUNTER — Ambulatory Visit: Payer: 59 | Admitting: Urology

## 2015-01-27 ENCOUNTER — Ambulatory Visit: Payer: 59 | Admitting: Urology

## 2015-03-04 ENCOUNTER — Other Ambulatory Visit: Payer: Self-pay | Admitting: Obstetrics and Gynecology

## 2015-03-04 DIAGNOSIS — Z13820 Encounter for screening for osteoporosis: Secondary | ICD-10-CM

## 2015-03-13 ENCOUNTER — Ambulatory Visit
Admission: RE | Admit: 2015-03-13 | Discharge: 2015-03-13 | Disposition: A | Discharge status: Home / Self Care | Payer: 59 | Source: Ambulatory Visit | Attending: Obstetrics and Gynecology | Admitting: Obstetrics and Gynecology

## 2015-03-13 DIAGNOSIS — Z13820 Encounter for screening for osteoporosis: Secondary | ICD-10-CM | POA: Insufficient documentation

## 2015-03-13 DIAGNOSIS — Z780 Asymptomatic menopausal state: Secondary | ICD-10-CM | POA: Insufficient documentation

## 2015-03-24 DIAGNOSIS — E7800 Pure hypercholesterolemia, unspecified: Secondary | ICD-10-CM | POA: Insufficient documentation

## 2016-02-10 ENCOUNTER — Ambulatory Visit
Admission: RE | Admit: 2016-02-10 | Discharge: 2016-02-10 | Disposition: A | Discharge status: Home / Self Care | Payer: 59 | Source: Ambulatory Visit | Attending: Physician Assistant | Admitting: Physician Assistant

## 2016-02-10 ENCOUNTER — Other Ambulatory Visit: Payer: Self-pay | Admitting: Physician Assistant

## 2016-02-10 DIAGNOSIS — H539 Unspecified visual disturbance: Secondary | ICD-10-CM | POA: Diagnosis present

## 2016-02-10 DIAGNOSIS — H9313 Tinnitus, bilateral: Secondary | ICD-10-CM | POA: Diagnosis not present

## 2016-02-10 DIAGNOSIS — R202 Paresthesia of skin: Secondary | ICD-10-CM

## 2016-02-10 DIAGNOSIS — R209 Unspecified disturbances of skin sensation: Principal | ICD-10-CM

## 2016-02-10 DIAGNOSIS — R29898 Other symptoms and signs involving the musculoskeletal system: Secondary | ICD-10-CM

## 2016-04-19 DIAGNOSIS — R030 Elevated blood-pressure reading, without diagnosis of hypertension: Secondary | ICD-10-CM | POA: Diagnosis not present

## 2016-04-19 DIAGNOSIS — R29898 Other symptoms and signs involving the musculoskeletal system: Secondary | ICD-10-CM | POA: Diagnosis not present

## 2016-04-19 DIAGNOSIS — R7301 Impaired fasting glucose: Secondary | ICD-10-CM | POA: Diagnosis not present

## 2016-04-26 DIAGNOSIS — Z0000 Encounter for general adult medical examination without abnormal findings: Secondary | ICD-10-CM | POA: Diagnosis not present

## 2016-04-26 DIAGNOSIS — E7800 Pure hypercholesterolemia, unspecified: Secondary | ICD-10-CM | POA: Diagnosis not present

## 2016-04-26 DIAGNOSIS — L659 Nonscarring hair loss, unspecified: Secondary | ICD-10-CM | POA: Diagnosis not present

## 2016-04-26 DIAGNOSIS — M8589 Other specified disorders of bone density and structure, multiple sites: Secondary | ICD-10-CM | POA: Insufficient documentation

## 2016-05-13 DIAGNOSIS — Z719 Counseling, unspecified: Secondary | ICD-10-CM | POA: Diagnosis not present

## 2016-05-19 DIAGNOSIS — Z719 Counseling, unspecified: Secondary | ICD-10-CM | POA: Diagnosis not present

## 2016-05-26 DIAGNOSIS — Z719 Counseling, unspecified: Secondary | ICD-10-CM | POA: Diagnosis not present

## 2016-06-02 DIAGNOSIS — Z719 Counseling, unspecified: Secondary | ICD-10-CM | POA: Diagnosis not present

## 2016-06-09 DIAGNOSIS — Z719 Counseling, unspecified: Secondary | ICD-10-CM | POA: Diagnosis not present

## 2016-06-16 DIAGNOSIS — Z719 Counseling, unspecified: Secondary | ICD-10-CM | POA: Diagnosis not present

## 2016-07-12 ENCOUNTER — Ambulatory Visit (INDEPENDENT_AMBULATORY_CARE_PROVIDER_SITE_OTHER): Payer: 59 | Admitting: Certified Nurse Midwife

## 2016-07-12 ENCOUNTER — Encounter: Payer: Self-pay | Admitting: Certified Nurse Midwife

## 2016-07-12 VITALS — BP 122/84 | HR 66 | Ht 62.0 in | Wt 147.0 lb

## 2016-07-12 DIAGNOSIS — Z01419 Encounter for gynecological examination (general) (routine) without abnormal findings: Secondary | ICD-10-CM | POA: Diagnosis not present

## 2016-07-12 NOTE — Progress Notes (Signed)
Gynecology Annual Exam  PCP: Leotis Shames, MD  Chief Complaint:  Chief Complaint  Patient presents with  . Gynecologic Exam    History of Present Illness: Karen Maldonado presents today for her annual exam. She is a 56 year old African American/Black female , G 2 P 2 0 0 2 , who is postmenopausal . She presents for her annual exam. She has had no spotting.   The patient's past medical history is notable for a history of a TLH and BSO in 2006 for fibroids.. She has never used HT. Dr Chilton Si is her PCP and he follows her for hypertension and GERD. She has sensorineural deafness. Since her last annual GYN exam dated 02/25/2015,, she continues to have problems with episodes of vertigo. She has also lost 11# by watching her diet. Has joined a nutrition/weight loss program at work called [employer].   Her most recent pap smear was obtained 01/22/2014 and was NIL/neg HRHPV  Her most recent mammogram obtained on 02/25/2015 was normal and revealed no significant changes. There is no family history of breast cancer. There is no family history of ovarian cancer. The patient does do occasional self breast exams.  She had a colonoscopy in 2014 that was normal. Her next colonoscopy is due in 10 years.  She denies a recent DEXA scan 03/13/2015 revealed osteopenia (Femur T score=-1.6) The patient does not smoke.  The patient does not drink alcohol.  The patient does not use illegal drugs.  The patient does  Exercise by walking 45 minutes/day The patient does get adequate calcium in her diet and with her calcium supplement. She also takes vitamin D3 supplements. Her routine screening blood work is managed by her PCP.     Review of Systems: Review of Systems  Constitutional: Positive for weight loss (intentional). Negative for chills and fever.  HENT: Negative for congestion, sinus pain and sore throat.   Eyes: Negative for blurred vision and pain.  Respiratory: Negative for hemoptysis,  shortness of breath and wheezing.   Cardiovascular: Negative for chest pain, palpitations and leg swelling.  Gastrointestinal: Negative for abdominal pain, blood in stool, diarrhea, heartburn, nausea and vomiting.  Genitourinary: Negative for dysuria, frequency, hematuria and urgency.  Musculoskeletal: Negative for back pain, joint pain and myalgias.  Skin: Negative for itching and rash.  Neurological: Positive for dizziness. Negative for tingling and headaches.  Endo/Heme/Allergies: Negative for environmental allergies and polydipsia. Does not bruise/bleed easily.       Negative for hirsutism   Psychiatric/Behavioral: Negative for depression. The patient is not nervous/anxious and does not have insomnia.     Past Medical History:  Past Medical History:  Diagnosis Date  . Allergic rhinitis 07/05/2011  . DDD (degenerative disc disease), cervical 05/09/2012  . Deafness, sensorineural 01/05/2012  . Dizziness 06/24/2011   Overview:  negative ENT work-up Negative cardiac work-up including Holter, ECHO and stress test   . Essential (primary) hypertension 02/04/2014  . Gastro-esophageal reflux disease without esophagitis 11/19/2013  . GERD (gastroesophageal reflux disease)   . Hematuria    microscopic  . Infection of the upper respiratory tract 07/05/2011  . Near syncope 11/02/2013    Past Surgical History:  Past Surgical History:  Procedure Laterality Date  . CHOLECYSTECTOMY    . COLONOSCOPY  2014  . TOTAL ABDOMINAL HYSTERECTOMY  2006   TLH-BSO for fibroids Dr. Luella Cook    Family History:  Family History  Problem Relation Age of Onset  . Stroke Father   .  Hypertension Father   . Dementia Father   . Hypertension Mother   . Stroke Sister   . Stroke Sister     Social History:  Social History   Social History  . Marital status: Married    Spouse name: N/A  . Number of children: 2  . Years of education: 16   Occupational History  . 2    Social History Main Topics  . Smoking  status: Never Smoker  . Smokeless tobacco: Never Used  . Alcohol use No  . Drug use: No  . Sexual activity: Not Currently   Other Topics Concern  . Not on file   Social History Narrative  . No narrative on file    Allergies:  Allergies  Allergen Reactions  . Penicillin G Hives  . Sulfa Antibiotics Nausea And Vomiting and Rash    Medications: Prior to Admission medications   Medication Sig Start Date End Date Taking? Authorizing Provider  hydrochlorothiazide (HYDRODIURIL) 12.5 MG tablet TAKE 1 TABLET (12.5 MG TOTAL) BY MOUTH ONCE DAILY. 11/27/15  Yes Historical Provider, MD  metoprolol succinate (TOPROL-XL) 25 MG 24 hr tablet Take by mouth. 03/24/15  Yes Historical Provider, MD  aspirin EC 81 MG tablet Take by mouth.    Historical Provider, MD  Cholecalciferol (VITAMIN D) 2000 units tablet Take by mouth.    Historical Provider, MD  pantoprazole (PROTONIX) 40 MG tablet  06/30/16   Historical Provider, MD  Multivitamin for women and Calcium 500 mgm  Physical Exam Vitals: Blood pressure 122/84, pulse 66, height  (1.575 m), weight 66.7 kg (147 lb).  General: NAD HEENT: normocephalic, anicteric Thyroid: no enlargement, no palpable nodules Pulmonary: No increased work of breathing, CTAB Cardiovascular: RRR, without murmur Breast: Breast symmetrical, no tenderness, no palpable nodules or masses, no skin or nipple retraction present, no nipple discharge.  No axillary, infraclavicular,  or supraclavicular lymphadenopathy. Abdomen: soft, non-tender, non-distended.  Umbilicus without lesions.  No hepatomegaly or masses palpable. No evidence of hernia  Genitourinary:  External: Normal external female genitalia.  Normal urethral meatus, normal Bartholin's and Skene's glands.    Vagina: Normal vaginal mucosa, no evidence of prolapse.    Cervix: surgically absent  Uterus: surgically absent  Adnexa: no adnexal masses, NT  Rectal: deferred  Lymphatic: no evidence of inguinal  lymphadenopathy Extremities: no edema, erythema, or tenderness Neurologic: Grossly intact Psychiatric: mood appropriate, affect full    Assessment: 56 y.o. [id number] well woman exam  Plan:   1) Mammogram - recommend yearly screening mammogram.  Patient to schedule her screening mammogram at [facility]   2) ASCCP guidelines and rational discussed.  Patient opts for every 3 year screening interval  3) Routine healthcare maintenance including cholesterol, diabetes screening discussed managed by PCP   4) RTO in 1 year and prn  Farrel Conners, CNM

## 2016-07-13 ENCOUNTER — Other Ambulatory Visit: Payer: Self-pay | Admitting: Internal Medicine

## 2016-07-13 DIAGNOSIS — Z1231 Encounter for screening mammogram for malignant neoplasm of breast: Secondary | ICD-10-CM

## 2016-07-13 LAB — PAP IG (IMAGE GUIDED): PAP SMEAR COMMENT: 0

## 2016-07-20 ENCOUNTER — Encounter: Payer: Self-pay | Admitting: Certified Nurse Midwife

## 2016-07-26 DIAGNOSIS — I10 Essential (primary) hypertension: Secondary | ICD-10-CM | POA: Diagnosis not present

## 2016-07-26 DIAGNOSIS — R55 Syncope and collapse: Secondary | ICD-10-CM | POA: Diagnosis not present

## 2016-07-26 DIAGNOSIS — E7800 Pure hypercholesterolemia, unspecified: Secondary | ICD-10-CM | POA: Diagnosis not present

## 2016-08-03 ENCOUNTER — Other Ambulatory Visit: Payer: Self-pay | Admitting: Internal Medicine

## 2016-08-03 ENCOUNTER — Ambulatory Visit
Admission: RE | Admit: 2016-08-03 | Discharge: 2016-08-03 | Disposition: A | Discharge status: Home / Self Care | Payer: 59 | Source: Ambulatory Visit | Attending: Internal Medicine | Admitting: Internal Medicine

## 2016-08-03 DIAGNOSIS — Z1231 Encounter for screening mammogram for malignant neoplasm of breast: Secondary | ICD-10-CM

## 2016-08-03 DIAGNOSIS — N644 Mastodynia: Secondary | ICD-10-CM

## 2016-08-04 ENCOUNTER — Other Ambulatory Visit: Payer: Self-pay | Admitting: Internal Medicine

## 2016-08-04 DIAGNOSIS — N644 Mastodynia: Secondary | ICD-10-CM

## 2016-08-04 DIAGNOSIS — Z1239 Encounter for other screening for malignant neoplasm of breast: Secondary | ICD-10-CM

## 2016-08-11 ENCOUNTER — Other Ambulatory Visit: Payer: Self-pay | Admitting: Internal Medicine

## 2016-08-11 DIAGNOSIS — Z1239 Encounter for other screening for malignant neoplasm of breast: Secondary | ICD-10-CM

## 2016-08-11 DIAGNOSIS — N644 Mastodynia: Secondary | ICD-10-CM

## 2016-08-12 DIAGNOSIS — G4730 Sleep apnea, unspecified: Secondary | ICD-10-CM | POA: Diagnosis not present

## 2016-10-25 DIAGNOSIS — I10 Essential (primary) hypertension: Secondary | ICD-10-CM | POA: Diagnosis not present

## 2016-10-25 DIAGNOSIS — R7303 Prediabetes: Secondary | ICD-10-CM | POA: Diagnosis not present

## 2016-10-25 DIAGNOSIS — K219 Gastro-esophageal reflux disease without esophagitis: Secondary | ICD-10-CM | POA: Diagnosis not present

## 2016-10-27 ENCOUNTER — Other Ambulatory Visit (INDEPENDENT_AMBULATORY_CARE_PROVIDER_SITE_OTHER): Payer: Self-pay | Admitting: Vascular Surgery

## 2016-10-27 DIAGNOSIS — I773 Arterial fibromuscular dysplasia: Secondary | ICD-10-CM

## 2016-10-29 ENCOUNTER — Ambulatory Visit (INDEPENDENT_AMBULATORY_CARE_PROVIDER_SITE_OTHER): Payer: 59

## 2016-10-29 ENCOUNTER — Ambulatory Visit (INDEPENDENT_AMBULATORY_CARE_PROVIDER_SITE_OTHER): Payer: 59 | Admitting: Vascular Surgery

## 2016-10-29 ENCOUNTER — Encounter (INDEPENDENT_AMBULATORY_CARE_PROVIDER_SITE_OTHER): Payer: Self-pay | Admitting: Vascular Surgery

## 2016-10-29 VITALS — BP 143/83 | HR 64 | Resp 16 | Wt 146.8 lb

## 2016-10-29 DIAGNOSIS — I10 Essential (primary) hypertension: Secondary | ICD-10-CM | POA: Diagnosis not present

## 2016-10-29 DIAGNOSIS — I773 Arterial fibromuscular dysplasia: Secondary | ICD-10-CM

## 2016-10-29 DIAGNOSIS — E785 Hyperlipidemia, unspecified: Secondary | ICD-10-CM

## 2016-10-29 NOTE — Assessment & Plan Note (Signed)
lipid control important in reducing the progression of atherosclerotic disease.   

## 2016-10-29 NOTE — Assessment & Plan Note (Signed)
blood pressure control important in reducing the progression of atherosclerotic disease. On appropriate oral medications.  

## 2016-10-29 NOTE — Progress Notes (Signed)
MRN : [id number]  Karen Maldonado is a 56 y.o. (21-Oct-1960) female who presents with chief complaint of  Chief Complaint  Patient presents with  . ultrasound follow up  .  History of Present Illness: Patient returns in follow up of carotid disease.  She is doing well. She denies any focal neurologic symptoms. Specifically, the patient denies amaurosis fugax, speech or swallowing difficulties, or arm or leg weakness or numbness Her carotid duplex today shows no hemodynamically significant carotid artery stenosis bilaterally with some elevated end-diastolic velocities consistent with her previous diagnosis of fibromuscular dysplasia.  Current Outpatient Prescriptions  Medication Sig Dispense Refill  . aspirin EC 81 MG tablet Take by mouth.    . Cholecalciferol (VITAMIN D) 2000 units tablet Take by mouth.    . hydrochlorothiazide (HYDRODIURIL) 12.5 MG tablet TAKE 1 TABLET (12.5 MG TOTAL) BY MOUTH ONCE DAILY.    . metoprolol succinate (TOPROL-XL) 25 MG 24 hr tablet Take by mouth.    . Multiple Vitamins-Minerals (MULTIVITAMIN WOMEN PO) Take 1 tablet by mouth daily at 2 PM.    . pantoprazole (PROTONIX) 40 MG tablet     . calcium-vitamin D (OSCAL WITH D) 500-200 MG-UNIT tablet Take 1 tablet by mouth daily at 2 PM.     No current facility-administered medications for this visit.     Past Medical History:  Diagnosis Date  . Allergic rhinitis 07/05/2011  . DDD (degenerative disc disease), cervical 05/09/2012  . Deafness, sensorineural 01/05/2012  . Dizziness 06/24/2011   Overview:  negative ENT work-up Negative cardiac work-up including Holter, ECHO and stress test   . Essential (primary) hypertension 02/04/2014  . Gastro-esophageal reflux disease without esophagitis 11/19/2013  . GERD (gastroesophageal reflux disease)   . Hematuria    microscopic  . Infection of the upper respiratory tract 07/05/2011  . Near syncope 11/02/2013    Past Surgical History:  Procedure Laterality Date  .  CHOLECYSTECTOMY    . COLONOSCOPY  2014  . TOTAL ABDOMINAL HYSTERECTOMY  2006   TLH-BSO for fibroids Dr. Luella Cookosenow    Social History Social History  Substance Use Topics  . Smoking status: Never Smoker  . Smokeless tobacco: Never Used  . Alcohol use No     Family History Family History  Problem Relation Age of Onset  . Stroke Father   . Hypertension Father   . Dementia Father   . Hypertension Mother   . Stroke Sister   . Stroke Sister   . Breast cancer Neg Hx     Allergies  Allergen Reactions  . Penicillin G Hives  . Sulfa Antibiotics Nausea And Vomiting and Rash     REVIEW OF SYSTEMS (Negative unless checked)  Constitutional: [] Weight loss  [] Fever  [] Chills Cardiac: [] Chest pain   [] Chest pressure   [] Palpitations   [] Shortness of breath when laying flat   [] Shortness of breath at rest   [] Shortness of breath with exertion. Vascular:  [] Pain in legs with walking   [] Pain in legs at rest   [] Pain in legs when laying flat   [] Claudication   [] Pain in feet when walking  [] Pain in feet at rest  [] Pain in feet when laying flat   [] History of DVT   [] Phlebitis   [x] Swelling in legs   [] Varicose veins   [] Non-healing ulcers Pulmonary:   [] Uses home oxygen   [] Productive cough   [] Hemoptysis   [] Wheeze  [] COPD   [] Asthma Neurologic:  [x] Dizziness  [x] Blackouts   [] Seizures   []   History of stroke   [] History of TIA  [] Aphasia   [] Temporary blindness   [] Dysphagia   [] Weakness or numbness in arms   [] Weakness or numbness in legs Musculoskeletal:  [x] Arthritis   [] Joint swelling   [] Joint pain   [x] Low back pain Hematologic:  [] Easy bruising  [] Easy bleeding   [] Hypercoagulable state   [] Anemic  [] Hepatitis Gastrointestinal:  [] Blood in stool   [] Vomiting blood  [x] Gastroesophageal reflux/heartburn   [] Difficulty swallowing. Genitourinary:  [] Chronic kidney disease   [] Difficult urination  [] Frequent urination  [] Burning with urination   [x] Blood in urine Skin:  [] Rashes   [] Ulcers    [] Wounds Psychological:  [] History of anxiety   []  History of major depression.  Physical Examination  Vitals:   10/29/16 1357  BP: (!) 143/83  Pulse: 64  Resp: 16  Weight: 66.6 kg (146 lb 12.8 oz)   Body mass index is 26.85 kg/m. Gen:  WD/WN, NAD Head: [place]/AT, No temporalis wasting. Ear/Nose/Throat: Hearing grossly intact, nares w/o erythema or drainage, trachea midline Eyes: Conjunctiva clear. Sclera non-icteric Neck: Supple.  No bruit or JVD.  Pulmonary:  Good air movement, equal and clear to auscultation bilaterally.  Cardiac: RRR, normal S1, S2, no Murmurs, rubs or gallops. Vascular:  Vessel Right Left  Radial Palpable Palpable                                     Musculoskeletal: M/S 5/5 throughout.  No deformity or atrophy.  Neurologic: CN 2-12 intact. Sensation grossly intact in extremities.  Symmetrical.  Speech is fluent. Motor exam as listed above. Psychiatric: Judgment intact, Mood & affect appropriate for pt's clinical situation. Dermatologic: No rashes or ulcers noted.  No cellulitis or open wounds.      CBC Lab Results  Component Value Date   WBC 9.4 07/02/2012   HGB 14.2 07/02/2012   HCT 40.8 07/02/2012   MCV 90 07/02/2012   PLT 251 07/02/2012    BMET    Component Value Date/Time   NA 141 07/02/2012 2228   K 3.4 (L) 07/02/2012 2228   CL 107 07/02/2012 2228   CO2 32 07/02/2012 2228   GLUCOSE 140 (H) 07/02/2012 2228   BUN 17 07/02/2012 2228   CREATININE 0.73 07/02/2012 2228   CALCIUM 9.0 07/02/2012 2228   GFRNONAA >60 07/02/2012 2228   GFRAA >60 07/02/2012 2228   CrCl cannot be calculated (Patient's most recent lab result is older than the maximum 21 days allowed.).  COAG No results found for: INR, PROTIME  Radiology No results found.   Assessment/Plan Benign essential HTN blood pressure control important in reducing the progression of atherosclerotic disease. On appropriate oral medications.   HLD (hyperlipidemia) lipid  control important in reducing the progression of atherosclerotic disease.    Fibromuscular dysplasia of cervicocranial artery (HCC) Her carotid duplex today shows no hemodynamically significant carotid artery stenosis bilaterally with some elevated end-diastolic velocities consistent with her previous diagnosis of fibromuscular dysplasia. Continue aspirin. Recheck in 2 years with duplex.    Festus BarrenJason Dew, MD  10/29/2016 2:47 PM    This note was created with Dragon medical transcription system.  Any errors from dictation are purely unintentional

## 2016-10-29 NOTE — Assessment & Plan Note (Signed)
Her carotid duplex today shows no hemodynamically significant carotid artery stenosis bilaterally with some elevated end-diastolic velocities consistent with her previous diagnosis of fibromuscular dysplasia. Continue aspirin. Recheck in 2 years with duplex.

## 2016-10-31 DIAGNOSIS — R7303 Prediabetes: Secondary | ICD-10-CM | POA: Insufficient documentation

## 2016-11-25 DIAGNOSIS — R55 Syncope and collapse: Secondary | ICD-10-CM | POA: Diagnosis not present

## 2016-11-25 DIAGNOSIS — E785 Hyperlipidemia, unspecified: Secondary | ICD-10-CM | POA: Diagnosis not present

## 2016-11-25 DIAGNOSIS — I10 Essential (primary) hypertension: Secondary | ICD-10-CM | POA: Diagnosis not present

## 2016-12-21 ENCOUNTER — Encounter: Payer: Self-pay | Admitting: Emergency Medicine

## 2016-12-21 ENCOUNTER — Emergency Department: Payer: 59

## 2016-12-21 DIAGNOSIS — I10 Essential (primary) hypertension: Secondary | ICD-10-CM | POA: Insufficient documentation

## 2016-12-21 DIAGNOSIS — R0789 Other chest pain: Secondary | ICD-10-CM | POA: Diagnosis not present

## 2016-12-21 DIAGNOSIS — R079 Chest pain, unspecified: Secondary | ICD-10-CM | POA: Insufficient documentation

## 2016-12-21 DIAGNOSIS — Z7982 Long term (current) use of aspirin: Secondary | ICD-10-CM | POA: Insufficient documentation

## 2016-12-21 DIAGNOSIS — Z79899 Other long term (current) drug therapy: Secondary | ICD-10-CM | POA: Diagnosis not present

## 2016-12-21 LAB — BASIC METABOLIC PANEL
ANION GAP: 12 (ref 5–15)
BUN: 15 mg/dL (ref 6–20)
CO2: 26 mmol/L (ref 22–32)
Calcium: 9.7 mg/dL (ref 8.9–10.3)
Chloride: 103 mmol/L (ref 101–111)
Creatinine, Ser: 0.91 mg/dL (ref 0.44–1.00)
Glucose, Bld: 157 mg/dL — ABNORMAL HIGH (ref 65–99)
Potassium: 3.3 mmol/L — ABNORMAL LOW (ref 3.5–5.1)
Sodium: 141 mmol/L (ref 135–145)

## 2016-12-21 LAB — CBC
HCT: 42.1 % (ref 35.0–47.0)
Hemoglobin: 14.5 g/dL (ref 12.0–16.0)
MCH: 30.4 pg (ref 26.0–34.0)
MCHC: 34.4 g/dL (ref 32.0–36.0)
MCV: 88.3 fL (ref 80.0–100.0)
Platelets: 282 10*3/uL (ref 150–440)
RBC: 4.77 MIL/uL (ref 3.80–5.20)
RDW: 13.1 % (ref 11.5–14.5)
WBC: 11.7 10*3/uL — ABNORMAL HIGH (ref 3.6–11.0)

## 2016-12-21 LAB — TROPONIN I

## 2016-12-21 NOTE — ED Triage Notes (Signed)
Patient ambulatory to triage with steady gait, without difficulty or distress noted; pt reports left sided CP since noon accomp by weakness while at work ; denies hx of same

## 2016-12-22 ENCOUNTER — Emergency Department
Admission: EM | Admit: 2016-12-22 | Discharge: 2016-12-22 | Disposition: A | Discharge status: Home / Self Care | Payer: 59 | Attending: Emergency Medicine | Admitting: Emergency Medicine

## 2016-12-22 DIAGNOSIS — I10 Essential (primary) hypertension: Secondary | ICD-10-CM | POA: Diagnosis not present

## 2016-12-22 DIAGNOSIS — K219 Gastro-esophageal reflux disease without esophagitis: Secondary | ICD-10-CM | POA: Diagnosis not present

## 2016-12-22 DIAGNOSIS — R0789 Other chest pain: Secondary | ICD-10-CM | POA: Diagnosis not present

## 2016-12-22 DIAGNOSIS — R079 Chest pain, unspecified: Secondary | ICD-10-CM

## 2016-12-22 LAB — TROPONIN I: Troponin I: <0.03 ng/mL (ref <0.03)

## 2016-12-22 LAB — TSH: TSH: 2.438 u[IU]/mL (ref 0.350–4.500)

## 2016-12-22 MED ORDER — POTASSIUM CHLORIDE 20 MEQ PO PACK
40.0000 meq | PACK | Freq: Two times a day (BID) | ORAL | Status: DC
  Administered 2016-12-22: 40 meq via ORAL
  Filled 2016-12-22: qty 2

## 2016-12-22 NOTE — ED Notes (Signed)

## 2016-12-22 NOTE — ED Provider Notes (Signed)
[facility] Emergency Department Provider Note    First MD Initiated Contact with Patient 12/22/16 [phone]     (approximate)  I have reviewed the triage vital signs and the nursing notes.   HISTORY  Chief Complaint Chest Pain   HPI Karen Maldonado is a 56 y.o. female with below list of primary medical conditions presents to the emergency department with intermittent left-sided chest pain times "weeks". Patient denies any pain at present stating that the episode occurred today at noon and was accompanied by generalized weakness while she was at work. Patient admits to bilateral hand tightness and facial tingling. Patient denies any tingling or numbness at this time. Patient denies any recent fever. Patient denies any palpitations.   Past Medical History:  Diagnosis Date  . Allergic rhinitis 07/05/2011  . DDD (degenerative disc disease), cervical 05/09/2012  . Deafness, sensorineural 01/05/2012  . Dizziness 06/24/2011   Overview:  negative ENT work-up Negative cardiac work-up including Holter, ECHO and stress test   . Essential (primary) hypertension 02/04/2014  . Gastro-esophageal reflux disease without esophagitis 11/19/2013  . GERD (gastroesophageal reflux disease)   . Hematuria    microscopic  . Infection of the upper respiratory tract 07/05/2011  . Near syncope 11/02/2013    Patient Active Problem List   Diagnosis Date Noted  . 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 Adaptive colitis 12/19/2014  . Microscopic hematuria 12/19/2014  . Atrophic vaginitis 12/19/2014  . Benign essential HTN 11/16/2014  . Unintended weight gain 11/16/2014  . Chest pain 07/17/2014  . Essential (primary) hypertension 02/04/2014  . Gastro-esophageal reflux disease without esophagitis 11/19/2013  . Near syncope 11/02/2013  . BP (high blood pressure) 09/12/2013  . Accumulation of fluid in tissues 08/25/2012  . DDD (degenerative disc disease),  cervical 05/09/2012  . HLD (hyperlipidemia) 05/09/2012  . Elevated fasting blood sugar 05/09/2012  . Avitaminosis D 05/09/2012  . Deafness, sensorineural 01/05/2012  . Allergic rhinitis 07/05/2011  . Infection of the upper respiratory tract 07/05/2011  . Abdominal pain, epigastric 06/24/2011  . Dizziness 06/24/2011    Past Surgical History:  Procedure Laterality Date  . CHOLECYSTECTOMY    . COLONOSCOPY  2014  . TOTAL ABDOMINAL HYSTERECTOMY  2006   [facility] for fibroids Dr. Luella Cook    Prior to Admission medications   Medication Sig Start Date End Date Taking? Authorizing Provider  aspirin EC 81 MG tablet Take by mouth.    [provider]  calcium-vitamin D (OSCAL WITH D) 500-200 MG-UNIT tablet Take 1 tablet by mouth daily at 2 PM.    [provider]  Cholecalciferol (VITAMIN D) 2000 units tablet Take by mouth.    [provider]  hydrochlorothiazide (HYDRODIURIL) 12.5 MG tablet TAKE 1 TABLET (12.5 MG TOTAL) BY MOUTH ONCE DAILY. 11/27/15   [provider]  metoprolol succinate (TOPROL-XL) 25 MG 24 hr tablet Take by mouth. 03/24/15   [provider]  Multiple Vitamins-Minerals (MULTIVITAMIN WOMEN PO) Take 1 tablet by mouth daily at 2 PM.    [provider]  pantoprazole (PROTONIX) 40 MG tablet  06/30/16   [provider]    Allergies Penicillin g and Sulfa antibiotics  Family History  Problem Relation Age of Onset  . Stroke Father   . Hypertension Father   . Dementia Father   . Hypertension Mother   . Stroke Sister   . Stroke Sister   . Breast cancer Neg Hx     Social History Social  History  Substance Use Topics  . Smoking status: Never Smoker  . Smokeless tobacco: Never Used  . Alcohol use No    Review of Systems Constitutional: No fever/chills Eyes: No visual changes. ENT: No sore throat. Cardiovascular: Positive for chest pain. Respiratory: Denies shortness of breath. Gastrointestinal: No abdominal  pain.  No nausea, no vomiting.  No diarrhea.  No constipation. Genitourinary: Negative for dysuria. Musculoskeletal: Negative for neck pain.  Negative for back pain. Integumentary: Negative for rash. Neurological: Negative for headaches, focal weakness or numbness.   ____________________________________________   PHYSICAL EXAM:  VITAL SIGNS: ED Triage Vitals  Enc Vitals Group     BP 12/21/16 2213 (!) 146/77     Pulse Rate 12/21/16 2213 74     Resp 12/21/16 2213 18     Temp 12/21/16 2213 97.7 F (36.5 C)     Temp Source 12/21/16 2213 Oral     SpO2 12/21/16 2213 100 %     Weight 12/21/16 2211 67.1 kg (148 lb)     Height 12/21/16 2211 1.575 m ( )     Head Circumference --      Peak Flow --      Pain Score 12/21/16 2211 2     Pain Loc --      Pain Edu? --      Excl. in GC? --     Constitutional: Alert and oriented. Well appearing and in no acute distress. Eyes: Conjunctivae are normal.  Head: Atraumatic. Mouth/Throat: Mucous membranes are moist.  Oropharynx non-erythematous. Neck: No stridor.   Cardiovascular: Normal rate, regular rhythm. Good peripheral circulation. Grossly normal heart sounds. Respiratory: Normal respiratory effort.  No retractions. Lungs CTAB. Gastrointestinal: Soft and nontender. No distention.  Musculoskeletal: No lower extremity tenderness nor edema. No gross deformities of extremities. Neurologic:  Normal speech and language. No gross focal neurologic deficits are appreciated.  Skin:  Skin is warm, dry and intact. No rash noted. Psychiatric: Mood and affect are normal. Speech and behavior are normal.  ____________________________________________   LABS (all labs ordered are listed, but only abnormal results are displayed)  Labs Reviewed  BASIC METABOLIC PANEL - Abnormal; Notable for the following:       Result Value   Potassium 3.3 (*)    Glucose, Bld 157 (*)    All other components within normal limits  CBC - Abnormal; Notable for the  following:    WBC 11.7 (*)    All other components within normal limits  TROPONIN I  TSH  TROPONIN I   ____________________________________________  EKG  ED ECG REPORT I, [place] N BROWN, the attending physician, personally viewed and interpreted this ECG.   Date: 12/22/2016  EKG Time: 10:09 PM  Rate: 74  Rhythm: Normal sinus rhythm  Axis: Normal  Intervals: Normal  ST&T Change: None  ____________________________________________  RADIOLOGY I, Navarino N BROWN, personally viewed and evaluated these images (plain radiographs) as part of my medical decision making, as well as reviewing the written report by the radiologist.  Dg Chest 2 View  Result Date: 12/21/2016 CLINICAL DATA:  Left-sided chest pain since noon. Weakness. History of hypertension. EXAM: CHEST  2 VIEW COMPARISON:  05/30/2014 FINDINGS: Normal heart size and pulmonary vascularity. No focal airspace disease or consolidation in the lungs. No blunting of costophrenic angles. No pneumothorax. Mediastinal contours appear intact. Surgical clips in the right upper quadrant. IMPRESSION: No active cardiopulmonary disease. Electronically Signed   By: Burman Nieves M.D.   On: 12/21/2016 23:01  Procedures   ____________________________________________   INITIAL IMPRESSION / ASSESSMENT AND PLAN / ED COURSE  Pertinent labs & imaging results that were available during my care of the patient were reviewed by me and considered in my medical decision making (see chart for details).  56 year old female presenting with above stated history and physical exam. Patient with intermittent chest pain no pain at present. EKG revealed no evidence of ischemia or infarction, laboratory data unremarkable including troponin 2. No clear etiology for the patient's intermittent chest pain will refer to primary care provider and Dr. Darrold Junker      ____________________________________________  FINAL CLINICAL IMPRESSION(S) / ED  DIAGNOSES  Final diagnoses:  Chest pain, unspecified type     MEDICATIONS GIVEN DURING THIS VISIT:  Medications  potassium chloride (KLOR-CON) packet 40 mEq (40 mEq Oral Given 12/22/16 0155)     NEW OUTPATIENT MEDICATIONS STARTED DURING THIS VISIT:  New Prescriptions   No medications on file    Modified Medications   No medications on file    Discontinued Medications   No medications on file     Note:  This document was prepared using Dragon voice recognition software and may include unintentional dictation errors.    Darci Current, MD 12/22/16 [phone]

## 2016-12-23 DIAGNOSIS — Z23 Encounter for immunization: Secondary | ICD-10-CM | POA: Diagnosis not present

## 2016-12-27 DIAGNOSIS — E7800 Pure hypercholesterolemia, unspecified: Secondary | ICD-10-CM | POA: Diagnosis not present

## 2016-12-27 DIAGNOSIS — R55 Syncope and collapse: Secondary | ICD-10-CM | POA: Diagnosis not present

## 2016-12-27 DIAGNOSIS — I10 Essential (primary) hypertension: Secondary | ICD-10-CM | POA: Diagnosis not present

## 2017-02-10 DIAGNOSIS — M5440 Lumbago with sciatica, unspecified side: Secondary | ICD-10-CM | POA: Diagnosis not present

## 2017-02-22 DIAGNOSIS — M5136 Other intervertebral disc degeneration, lumbar region: Secondary | ICD-10-CM | POA: Diagnosis not present

## 2017-02-22 DIAGNOSIS — M549 Dorsalgia, unspecified: Secondary | ICD-10-CM | POA: Diagnosis not present

## 2017-02-22 DIAGNOSIS — M5440 Lumbago with sciatica, unspecified side: Secondary | ICD-10-CM | POA: Diagnosis not present

## 2017-03-16 DIAGNOSIS — M545 Low back pain: Secondary | ICD-10-CM | POA: Diagnosis not present

## 2017-03-17 DIAGNOSIS — M5136 Other intervertebral disc degeneration, lumbar region: Secondary | ICD-10-CM | POA: Diagnosis not present

## 2017-03-17 DIAGNOSIS — M549 Dorsalgia, unspecified: Secondary | ICD-10-CM | POA: Diagnosis not present

## 2017-03-17 DIAGNOSIS — M5440 Lumbago with sciatica, unspecified side: Secondary | ICD-10-CM | POA: Diagnosis not present

## 2017-03-21 DIAGNOSIS — M545 Low back pain: Secondary | ICD-10-CM | POA: Diagnosis not present

## 2017-03-23 DIAGNOSIS — M545 Low back pain: Secondary | ICD-10-CM | POA: Diagnosis not present

## 2017-03-24 DIAGNOSIS — I10 Essential (primary) hypertension: Secondary | ICD-10-CM | POA: Diagnosis not present

## 2017-03-24 DIAGNOSIS — R55 Syncope and collapse: Secondary | ICD-10-CM | POA: Diagnosis not present

## 2017-03-24 DIAGNOSIS — E785 Hyperlipidemia, unspecified: Secondary | ICD-10-CM | POA: Diagnosis not present

## 2017-04-20 DIAGNOSIS — I10 Essential (primary) hypertension: Secondary | ICD-10-CM | POA: Diagnosis not present

## 2017-04-20 DIAGNOSIS — Z8639 Personal history of other endocrine, nutritional and metabolic disease: Secondary | ICD-10-CM | POA: Diagnosis not present

## 2017-04-20 DIAGNOSIS — R7303 Prediabetes: Secondary | ICD-10-CM | POA: Diagnosis not present

## 2017-04-27 ENCOUNTER — Other Ambulatory Visit: Payer: Self-pay | Admitting: Internal Medicine

## 2017-04-27 DIAGNOSIS — Z1231 Encounter for screening mammogram for malignant neoplasm of breast: Secondary | ICD-10-CM | POA: Diagnosis not present

## 2017-04-27 DIAGNOSIS — M8589 Other specified disorders of bone density and structure, multiple sites: Secondary | ICD-10-CM | POA: Diagnosis not present

## 2017-04-27 DIAGNOSIS — Z0000 Encounter for general adult medical examination without abnormal findings: Secondary | ICD-10-CM | POA: Diagnosis not present

## 2017-04-27 DIAGNOSIS — Z1239 Encounter for other screening for malignant neoplasm of breast: Secondary | ICD-10-CM

## 2017-04-29 ENCOUNTER — Encounter (INDEPENDENT_AMBULATORY_CARE_PROVIDER_SITE_OTHER): Payer: Self-pay

## 2017-05-03 DIAGNOSIS — M8588 Other specified disorders of bone density and structure, other site: Secondary | ICD-10-CM | POA: Diagnosis not present

## 2017-05-11 DIAGNOSIS — N3000 Acute cystitis without hematuria: Secondary | ICD-10-CM | POA: Diagnosis not present

## 2017-05-14 ENCOUNTER — Other Ambulatory Visit: Payer: Self-pay

## 2017-05-14 ENCOUNTER — Encounter (HOSPITAL_COMMUNITY): Payer: Self-pay | Admitting: *Deleted

## 2017-05-14 ENCOUNTER — Emergency Department (HOSPITAL_COMMUNITY)
Admission: EM | Admit: 2017-05-14 | Discharge: 2017-05-15 | Disposition: A | Discharge status: Home / Self Care | Payer: 59 | Attending: Emergency Medicine | Admitting: Emergency Medicine

## 2017-05-14 DIAGNOSIS — R112 Nausea with vomiting, unspecified: Secondary | ICD-10-CM | POA: Diagnosis not present

## 2017-05-14 DIAGNOSIS — E876 Hypokalemia: Secondary | ICD-10-CM

## 2017-05-14 DIAGNOSIS — Z7982 Long term (current) use of aspirin: Secondary | ICD-10-CM | POA: Diagnosis not present

## 2017-05-14 DIAGNOSIS — I10 Essential (primary) hypertension: Secondary | ICD-10-CM | POA: Insufficient documentation

## 2017-05-14 DIAGNOSIS — R197 Diarrhea, unspecified: Secondary | ICD-10-CM | POA: Diagnosis not present

## 2017-05-14 DIAGNOSIS — Z79899 Other long term (current) drug therapy: Secondary | ICD-10-CM | POA: Insufficient documentation

## 2017-05-14 MED ORDER — ONDANSETRON 4 MG PO TBDP
ORAL_TABLET | ORAL | Status: AC
  Administered 2017-05-14: 4 mg
  Filled 2017-05-14: qty 1

## 2017-05-14 MED ORDER — ONDANSETRON 4 MG PO TBDP: 4.0000 mg | ORAL_TABLET | Freq: Once | ORAL | Status: DC | PRN

## 2017-05-14 NOTE — ED Notes (Signed)
Pt brought back to room from triage waiting room after family stated pt was in bathroom stating she felt like she was going to pass out. Assisted pt to wheelchair and brought straight back to room. Pt very weak.

## 2017-05-14 NOTE — ED Triage Notes (Addendum)
Pt reports abdominal pain,n/v/d since 8pm. Pt dry heaving in triage and was given 4 mg pf ODT zofran per triage protocol.

## 2017-05-15 LAB — CBC WITH DIFFERENTIAL/PLATELET
BASOS PCT: 0 %
Basophils Absolute: 0 10*3/uL (ref 0.0–0.1)
EOS ABS: 0 10*3/uL (ref 0.0–0.7)
EOS PCT: 0 %
HCT: 44.1 % (ref 36.0–46.0)
Hemoglobin: 14.9 g/dL (ref 12.0–15.0)
LYMPHS ABS: 0.4 10*3/uL — AB (ref 0.7–4.0)
Lymphocytes Relative: 2 %
MCH: 30.5 pg (ref 26.0–34.0)
MCHC: 33.8 g/dL (ref 30.0–36.0)
MCV: 90.4 fL (ref 78.0–100.0)
MONO ABS: 1.1 10*3/uL — AB (ref 0.1–1.0)
MONOS PCT: 5 %
Neutro Abs: 19 10*3/uL — ABNORMAL HIGH (ref 1.7–7.7)
Neutrophils Relative %: 93 %
Platelets: 254 10*3/uL (ref 150–400)
RBC: 4.88 MIL/uL (ref 3.87–5.11)
RDW: 12.6 % (ref 11.5–15.5)
WBC: 20.5 10*3/uL — ABNORMAL HIGH (ref 4.0–10.5)

## 2017-05-15 LAB — COMPREHENSIVE METABOLIC PANEL
ALT: 20 U/L (ref 14–54)
AST: 29 U/L (ref 15–41)
Albumin: 4.6 g/dL (ref 3.5–5.0)
Alkaline Phosphatase: 66 U/L (ref 38–126)
Anion gap: 15 (ref 5–15)
BUN: 23 mg/dL — ABNORMAL HIGH (ref 6–20)
CHLORIDE: 103 mmol/L (ref 101–111)
CO2: 24 mmol/L (ref 22–32)
CREATININE: 1.02 mg/dL — AB (ref 0.44–1.00)
Calcium: 9.6 mg/dL (ref 8.9–10.3)
GFR calc Af Amer: >60 mL/min (ref >60)
GFR, EST NON AFRICAN AMERICAN: 59 mL/min — AB (ref >60)
Glucose, Bld: 182 mg/dL — ABNORMAL HIGH (ref 65–99)
Potassium: 2.9 mmol/L — ABNORMAL LOW (ref 3.5–5.1)
SODIUM: 142 mmol/L (ref 135–145)
TOTAL PROTEIN: 7.5 g/dL (ref 6.5–8.1)
Total Bilirubin: 1.4 mg/dL — ABNORMAL HIGH (ref 0.3–1.2)

## 2017-05-15 LAB — URINALYSIS, ROUTINE W REFLEX MICROSCOPIC
BILIRUBIN URINE: NEGATIVE
Bacteria, UA: NONE SEEN
GLUCOSE, UA: NEGATIVE mg/dL
KETONES UR: 20 mg/dL — AB
LEUKOCYTES UA: NEGATIVE
NITRITE: NEGATIVE
PH: 7 (ref 5.0–8.0)
Protein, ur: NEGATIVE mg/dL
SPECIFIC GRAVITY, URINE: 1.013 (ref 1.005–1.030)
Squamous Epithelial / LPF: NONE SEEN

## 2017-05-15 LAB — LIPASE, BLOOD: LIPASE: 19 U/L (ref 11–51)

## 2017-05-15 MED ORDER — POTASSIUM CHLORIDE 10 MEQ/100ML IV SOLN
10.0000 meq | Freq: Once | INTRAVENOUS | Status: AC
  Administered 2017-05-15: 10 meq via INTRAVENOUS
  Filled 2017-05-15: qty 100

## 2017-05-15 MED ORDER — ONDANSETRON 4 MG PO TBDP: 4.0000 mg | ORAL_TABLET | Freq: Three times a day (TID) | ORAL | 0 refills | Status: DC | PRN

## 2017-05-15 MED ORDER — POTASSIUM CHLORIDE CRYS ER 20 MEQ PO TBCR
20.0000 meq | EXTENDED_RELEASE_TABLET | Freq: Once | ORAL | Status: AC
  Administered 2017-05-15: 10 meq via ORAL
  Filled 2017-05-15: qty 1

## 2017-05-15 MED ORDER — POTASSIUM CHLORIDE ER 10 MEQ PO TBCR: 10.0000 meq | EXTENDED_RELEASE_TABLET | Freq: Every day | ORAL | 0 refills | Status: DC

## 2017-05-15 MED ORDER — ONDANSETRON HCL 4 MG/2ML IJ SOLN
4.0000 mg | Freq: Once | INTRAMUSCULAR | Status: AC
  Administered 2017-05-15: 4 mg via INTRAVENOUS
  Filled 2017-05-15: qty 2

## 2017-05-15 MED ORDER — SODIUM CHLORIDE 0.9 % IV BOLUS (SEPSIS)
1000.0000 mL | Freq: Once | INTRAVENOUS | Status: AC
  Administered 2017-05-15: 1000 mL via INTRAVENOUS

## 2017-05-15 MED ORDER — POTASSIUM CHLORIDE CRYS ER 20 MEQ PO TBCR
EXTENDED_RELEASE_TABLET | ORAL | Status: AC
  Filled 2017-05-15: qty 1

## 2017-05-15 NOTE — Discharge Instructions (Signed)
Make sure you are drinking plenty of fluids.  Take the entire course of the potassium to replace what was lost with your vomiting and diarrhea. Take the zofran if needed for continued nausea and vomiting.

## 2017-05-15 NOTE — ED Provider Notes (Signed)
[facility] EMERGENCY DEPARTMENT Provider Note   CSN: [id number] Arrival date & time: 05/14/17  2231     History   Chief Complaint Chief Complaint  Patient presents with  . Emesis    HPI Karen Rosealee Albeeaylor Maldonado is a 57 y.o. female with a history of hypertension, GERD, and prior history of colitis presenting with nausea, vomiting and diarrhea which started around 5 PM this evening.  She reports her husband is currently admitted at [facility] with similar symptoms, although his symptoms also involve an intestinal blockage.  She has had 8-10 episodes of nonbloody emesis and similar quantity of diarrhea with inability to maintain any p.o. intake.  She has increasing weakness and feels dehydrated.  She also has abdominal cramping which improves with vomiting or diarrhea.  She has had no medications prior to arrival.  She denies any foreign travel, no improperly cooked food.  She did have 2 doses of an antibiotic (unsure of name) earlier this week for UTI which caused "stomach upset" so she stopped this medication.  She has not had ongoing dysuria symptoms.  She has had no medications prior to arrival for this evening symptoms.  The history is provided by the patient and a relative.    Past Medical History:  Diagnosis Date  . Allergic rhinitis 07/05/2011  . DDD (degenerative disc disease), cervical 05/09/2012  . Deafness, sensorineural 01/05/2012  . Dizziness 06/24/2011   Overview:  negative ENT work-up Negative cardiac work-up including Holter, ECHO and stress test   . Essential (primary) hypertension 02/04/2014  . Gastro-esophageal reflux disease without esophagitis 11/19/2013  . GERD (gastroesophageal reflux disease)   . Hematuria    microscopic  . Infection of the upper respiratory tract 07/05/2011  . Near syncope 11/02/2013    Patient Active Problem List   Diagnosis Date Noted  . 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 Adaptive colitis 12/19/2014  .  Microscopic hematuria 12/19/2014  . Atrophic vaginitis 12/19/2014  . Benign essential HTN 11/16/2014  . Unintended weight gain 11/16/2014  . Chest pain 07/17/2014  . Essential (primary) hypertension 02/04/2014  . Gastro-esophageal reflux disease without esophagitis 11/19/2013  . Near syncope 11/02/2013  . BP (high blood pressure) 09/12/2013  . Accumulation of fluid in tissues 08/25/2012  . DDD (degenerative disc disease), cervical 05/09/2012  . HLD (hyperlipidemia) 05/09/2012  . Elevated fasting blood sugar 05/09/2012  . Avitaminosis D 05/09/2012  . Deafness, sensorineural 01/05/2012  . Allergic rhinitis 07/05/2011  . Infection of the upper respiratory tract 07/05/2011  . Abdominal pain, epigastric 06/24/2011  . Dizziness 06/24/2011    Past Surgical History:  Procedure Laterality Date  . CHOLECYSTECTOMY    . COLONOSCOPY  2014  . TOTAL ABDOMINAL HYSTERECTOMY  2006   [facility] for fibroids Dr. Luella Cookosenow    OB History    Gravida Para Term Preterm AB Living   2 2 2     2    SAB TAB Ectopic Multiple Live Births           2       Home Medications    Prior to Admission medications   Medication Sig Start Date End Date Taking? Authorizing Provider  aspirin EC 81 MG tablet Take by mouth.    [provider]  calcium-vitamin D (OSCAL WITH D) 500-200 MG-UNIT tablet Take 1 tablet by mouth daily at 2 PM.    [provider]  Cholecalciferol (VITAMIN D) 2000 units tablet Take by mouth.    [provider]  hydrochlorothiazide (HYDRODIURIL) 12.5 MG tablet TAKE 1 TABLET (12.5 MG TOTAL) BY MOUTH ONCE DAILY. 11/27/15   [provider]  metoprolol succinate (TOPROL-XL) 25 MG 24 hr tablet Take by mouth. 03/24/15   [provider]  Multiple Vitamins-Minerals (MULTIVITAMIN WOMEN PO) Take 1 tablet by mouth daily at 2 PM.    [provider]  ondansetron (ZOFRAN ODT) 4 MG disintegrating tablet Take 1 tablet (4 mg total) by mouth every 8 (eight) hours  as needed for nausea or vomiting. 05/15/17   Burgess Amor, PA-C  pantoprazole (PROTONIX) 40 MG tablet  06/30/16   [provider]  potassium chloride (K-DUR) 10 MEQ tablet Take 1 tablet (10 mEq total) by mouth daily. 05/15/17   Burgess Amor, PA-C    Family History Family History  Problem Relation Age of Onset  . Stroke Father   . Hypertension Father   . Dementia Father   . Hypertension Mother   . Stroke Sister   . Stroke Sister   . Breast cancer Neg Hx     Social History Social History   Tobacco Use  . Smoking status: Never Smoker  . Smokeless tobacco: Never Used  Substance Use Topics  . Alcohol use: No    Alcohol/week: 0.0 oz  . Drug use: No     Allergies   Penicillin g and Sulfa antibiotics   Review of Systems Review of Systems  Constitutional: Negative for chills and fever.  HENT: Negative for congestion and sore throat.   Eyes: Negative.   Respiratory: Negative for chest tightness and shortness of breath.   Cardiovascular: Negative for chest pain.  Gastrointestinal: Positive for abdominal pain, diarrhea, nausea and vomiting.  Genitourinary: Negative.   Musculoskeletal: Negative for arthralgias, joint swelling and neck pain.  Skin: Negative.  Negative for rash and wound.  Neurological: Positive for weakness. Negative for dizziness, light-headedness, numbness and headaches.  Psychiatric/Behavioral: Negative.      Physical Exam Updated Vital Signs BP 93/77 (BP Location: Left Arm)   Pulse (!) 116   Temp 98.3 F (36.8 C) (Oral)   Resp 20   Ht 5\' 2"  (1.575 m)   Wt 64.9 kg (143 lb)   SpO2 100%   BMI 26.16 kg/m   Physical Exam  Constitutional: She appears well-developed and well-nourished.  HENT:  Head: Normocephalic and atraumatic.  Mouth/Throat: Mucous membranes are dry.  Eyes: Conjunctivae are normal.  Neck: Normal range of motion.  Cardiovascular: Regular rhythm, normal heart sounds and intact distal pulses. Tachycardia present.    Pulmonary/Chest: Effort normal and breath sounds normal. She has no wheezes.  Abdominal: Soft. Bowel sounds are normal. There is no tenderness. There is no rigidity, no rebound and no guarding.  Musculoskeletal: Normal range of motion.  Neurological: She is alert.  Skin: Skin is warm and dry.  Psychiatric: She has a normal mood and affect.  Nursing note and vitals reviewed.    ED Treatments / Results  Labs (all labs ordered are listed, but only abnormal results are displayed) Labs Reviewed  COMPREHENSIVE METABOLIC PANEL - Abnormal; Notable for the following components:      Result Value   Potassium 2.9 (*)    Glucose, Bld 182 (*)    BUN 23 (*)    Creatinine, Ser 1.02 (*)    Total Bilirubin 1.4 (*)    GFR calc non Af Amer 59 (*)    All other components within normal limits  CBC WITH DIFFERENTIAL/PLATELET - Abnormal; Notable for the following  components:   WBC 20.5 (*)    Neutro Abs 19.0 (*)    Lymphs Abs 0.4 (*)    Monocytes Absolute 1.1 (*)    All other components within normal limits  LIPASE, BLOOD  URINALYSIS, ROUTINE W REFLEX MICROSCOPIC    EKG  EKG Interpretation None       Radiology No results found.  Procedures Procedures (including critical care time)  Medications Ordered in ED Medications  potassium chloride 10 mEq in 100 mL IVPB (10 mEq Intravenous New Bag/Given 05/15/17 0151)  potassium chloride SA (K-DUR,KLOR-CON) CR tablet 20 mEq (not administered)  ondansetron (ZOFRAN-ODT) 4 MG disintegrating tablet (4 mg  Given 05/14/17 2244)  sodium chloride 0.9 % bolus 1,000 mL (0 mLs Intravenous Stopped 05/15/17 0149)  ondansetron (ZOFRAN) injection 4 mg (4 mg Intravenous Given 05/15/17 0042)  sodium chloride 0.9 % bolus 1,000 mL (1,000 mLs Intravenous New Bag/Given 05/15/17 0151)     Initial Impression / Assessment and Plan / ED Course  I have reviewed the triage vital signs and the nursing notes.  Pertinent labs & imaging results that were available during my  care of the patient were reviewed by me and considered in my medical decision making (see chart for details).     Labs reviewed and discussed with patient.  Normal saline and states she feels improved with no emesis or diarrhea after receiving her dose of Zofran.  She has still been unable to give [place] a urine sample.  She is aware that we need to collect this as soon as she is able.  She was given both IV and oral potassium to replace losses.    Discussed patient with Dr. Lynelle Doctor who assumes care patient.  Final Clinical Impressions(s) / ED Diagnoses   Final diagnoses:  Nausea vomiting and diarrhea  Hypokalemia    ED Discharge Orders        Ordered    ondansetron (ZOFRAN ODT) 4 MG disintegrating tablet  Every 8 hours PRN     05/15/17 0203    potassium chloride (K-DUR) 10 MEQ tablet  Daily     05/15/17 0204       Burgess Amor, PA-C 05/15/17 [id number]    Devoria Albe, MD 05/15/17 [phone]

## 2017-06-30 ENCOUNTER — Other Ambulatory Visit: Payer: Self-pay | Admitting: Internal Medicine

## 2017-06-30 DIAGNOSIS — Z1231 Encounter for screening mammogram for malignant neoplasm of breast: Secondary | ICD-10-CM

## 2017-07-07 DIAGNOSIS — J019 Acute sinusitis, unspecified: Secondary | ICD-10-CM | POA: Diagnosis not present

## 2017-08-03 DIAGNOSIS — R7303 Prediabetes: Secondary | ICD-10-CM | POA: Diagnosis not present

## 2017-08-03 DIAGNOSIS — I10 Essential (primary) hypertension: Secondary | ICD-10-CM | POA: Diagnosis not present

## 2017-08-03 DIAGNOSIS — K219 Gastro-esophageal reflux disease without esophagitis: Secondary | ICD-10-CM | POA: Diagnosis not present

## 2017-08-04 ENCOUNTER — Ambulatory Visit
Admission: RE | Admit: 2017-08-04 | Discharge: 2017-08-04 | Disposition: A | Discharge status: Home / Self Care | Payer: 59 | Source: Ambulatory Visit | Attending: Internal Medicine | Admitting: Internal Medicine

## 2017-08-04 DIAGNOSIS — Z1231 Encounter for screening mammogram for malignant neoplasm of breast: Secondary | ICD-10-CM | POA: Diagnosis not present

## 2017-08-19 DIAGNOSIS — Z87898 Personal history of other specified conditions: Secondary | ICD-10-CM | POA: Diagnosis not present

## 2017-08-19 DIAGNOSIS — R079 Chest pain, unspecified: Secondary | ICD-10-CM | POA: Diagnosis not present

## 2017-08-25 DIAGNOSIS — R55 Syncope and collapse: Secondary | ICD-10-CM | POA: Diagnosis not present

## 2017-08-25 DIAGNOSIS — I493 Ventricular premature depolarization: Secondary | ICD-10-CM | POA: Diagnosis not present

## 2017-08-25 DIAGNOSIS — E7800 Pure hypercholesterolemia, unspecified: Secondary | ICD-10-CM | POA: Diagnosis not present

## 2017-08-25 DIAGNOSIS — I10 Essential (primary) hypertension: Secondary | ICD-10-CM | POA: Diagnosis not present

## 2017-11-24 DIAGNOSIS — B309 Viral conjunctivitis, unspecified: Secondary | ICD-10-CM | POA: Diagnosis not present

## 2017-12-08 DIAGNOSIS — I10 Essential (primary) hypertension: Secondary | ICD-10-CM | POA: Diagnosis not present

## 2017-12-08 DIAGNOSIS — R079 Chest pain, unspecified: Secondary | ICD-10-CM | POA: Diagnosis not present

## 2017-12-08 DIAGNOSIS — E7800 Pure hypercholesterolemia, unspecified: Secondary | ICD-10-CM | POA: Diagnosis not present

## 2017-12-24 DIAGNOSIS — Z23 Encounter for immunization: Secondary | ICD-10-CM | POA: Diagnosis not present

## 2018-01-31 DIAGNOSIS — R7303 Prediabetes: Secondary | ICD-10-CM | POA: Diagnosis not present

## 2018-07-17 ENCOUNTER — Ambulatory Visit: Payer: 59 | Admitting: Certified Nurse Midwife

## 2018-09-11 ENCOUNTER — Encounter: Payer: Self-pay | Admitting: Certified Nurse Midwife

## 2018-09-11 ENCOUNTER — Ambulatory Visit (INDEPENDENT_AMBULATORY_CARE_PROVIDER_SITE_OTHER): Payer: 59 | Admitting: Certified Nurse Midwife

## 2018-09-11 ENCOUNTER — Other Ambulatory Visit: Payer: Self-pay

## 2018-09-11 VITALS — BP 128/74 | HR 70 | Ht 62.0 in | Wt 139.0 lb

## 2018-09-11 DIAGNOSIS — Z1239 Encounter for other screening for malignant neoplasm of breast: Secondary | ICD-10-CM

## 2018-09-11 DIAGNOSIS — R300 Dysuria: Secondary | ICD-10-CM | POA: Diagnosis not present

## 2018-09-11 DIAGNOSIS — Z01419 Encounter for gynecological examination (general) (routine) without abnormal findings: Secondary | ICD-10-CM | POA: Diagnosis not present

## 2018-09-11 LAB — POCT URINALYSIS DIPSTICK
Appearance: NORMAL
Bilirubin, UA: NEGATIVE
Blood, UA: NEGATIVE
Glucose, UA: NEGATIVE
Ketones, UA: NEGATIVE
Nitrite, UA: NEGATIVE
Odor: NORMAL
Protein, UA: NEGATIVE
Spec Grav, UA: 1.01 (ref 1.010–1.025)
Urobilinogen, UA: 0.2 E.U./dL
pH, UA: 6 (ref 5.0–8.0)

## 2018-09-11 NOTE — Patient Instructions (Signed)
Call to schedule your mammogram.

## 2018-09-11 NOTE — Progress Notes (Signed)
Gynecology Annual Exam  PCP: Karen Maldonado, [name], MD  Chief Complaint:  Chief Complaint  Patient presents with  . Gynecologic Exam    No complaints    History of Present Illness: Karen Maldonado presents today for her annual exam. She is a 58 year old African American/Black female , G 2 P 2 0 0 2 , who is postmenopausal . She presents for her annual exam. She has had no spotting. She reports some hot flashes occaionally. Seems to be worse if she eats chocolate or drinks coffee. Has had some intermittent dysuria if she does not keep up her water intake.  The patient's past medical history is notable for a history of a TLH and BSO in 2006 for fibroids.. She has never used HT. Dr Chilton SiShingh is her PCP and he follows her for hypertension and GERD. She has sensorineural deafness and was diagnosed with fibromuscular dysplasia of the cervicocranial artery.. Since her last annual GYN exam dated 07/12/2016, she continues to have problems with episodes of vertigo. She has also lost 7# by watching her diet and walking. Has been out of work due to the [employer]ov[employer] 19 pandemic.   Her most recent pap smear was obtained 07/12/2016 and was NIL Her most recent mammogram obtained on 08/04/2017 was normal and revealed no significant changes. There is no family history of breast cancer. There is no family history of ovarian cancer. The patient does do occasional self breast exams.  She had a colonoscopy in 2014 that was normal. Her next colonoscopy is due in 10 years.  She denies a recent DEXA scan 03/13/2015 revealed osteopenia (Femur T score=-1.6) The patient does not smoke.  The patient does not drink alcohol.  The patient does not use illegal drugs.  The patient does exercise by walking most days of the week in her yard The patient does get adequate calcium in her diet and with her calcium supplement. She also takes vitamin D3 supplements. Her routine screening blood work is managed by her PCP.     Review of  Systems: Review of Systems  Constitutional: Positive for weight loss (intentional). Negative for chills and fever.  HENT: Negative for congestion, sinus pain and sore throat.   Eyes: Negative for blurred vision and pain.  Respiratory: Negative for hemoptysis, shortness of breath and wheezing.   Cardiovascular: Negative for chest pain, palpitations and leg swelling.  Gastrointestinal: Negative for abdominal pain, blood in stool, diarrhea, heartburn, nausea and vomiting.  Genitourinary: Negative for dysuria, frequency, hematuria and urgency.  Musculoskeletal: Negative for back pain, joint pain and myalgias.  Skin: Negative for itching and rash.  Neurological: Positive for dizziness. Negative for tingling and headaches.  Endo/Heme/Allergies: Negative for environmental allergies and polydipsia. Does not bruise/bleed easily.       Negative for hirsutism   Psychiatric/Behavioral: Negative for depression. The patient is not nervous/anxious and does not have insomnia.     Past Medical History:  Past Medical History:  Diagnosis Date  . Allergic rhinitis 07/05/2011  . DDD (degenerative disc disease), cervical 05/09/2012  . Deafness, sensorineural 01/05/2012  . Dizziness 06/24/2011   Overview:  negative ENT work-up Negative cardiac work-up including Holter, ECHO and stress test   . Essential (primary) hypertension 02/04/2014  . Fibromuscular dysplasia of cervicocranial artery (HCC)   . Gastro-esophageal reflux disease without esophagitis 11/19/2013  . GERD (gastroesophageal reflux disease)   . Hematuria    microscopic  . Infection of the upper respiratory tract 07/05/2011  .  Near syncope 11/02/2013    Past Surgical History:  Past Surgical History:  Procedure Laterality Date  . CHOLECYSTECTOMY    . COLONOSCOPY  2014  . TOTAL ABDOMINAL HYSTERECTOMY  2006   TLH-BSO for fibroids Dr. Luella Cookosenow    Family History:  Family History  Problem Relation Age of Onset  . Stroke Father   . Hypertension  Father   . Dementia Father   . Hypertension Mother   . Stroke Sister   . Stroke Sister   . Breast cancer Neg Hx     Social History:  Social History   Socioeconomic History  . Marital status: Married    Spouse name: Not on file  . Number of children: 2  . Years of education: 5916  . Highest education level: Not on file  Occupational History  . Occupation: 2  Social Needs  . Financial resource strain: Not on file  . Food insecurity:    Worry: Not on file    Inability: Not on file  . Transportation needs:    Medical: Not on file    Non-medical: Not on file  Tobacco Use  . Smoking status: Never Smoker  . Smokeless tobacco: Never Used  Substance and Sexual Activity  . Alcohol use: No    Alcohol/week: 0.0 standard drinks  . Drug use: No  . Sexual activity: Not Currently  Lifestyle  . Physical activity:    Days per week: Not on file    Minutes per session: Not on file  . Stress: Not on file  Relationships  . Social connections:    Talks on phone: Not on file    Gets together: Not on file    Attends religious service: Not on file    Active member of club or organization: Not on file    Attends meetings of clubs or organizations: Not on file    Relationship status: Not on file  . Intimate partner violence:    Fear of current or ex partner: Not on file    Emotionally abused: Not on file    Physically abused: Not on file    Forced sexual activity: Not on file  Other Topics Concern  . Not on file  Social History Narrative  . Not on file    Allergies:  Allergies  Allergen Reactions  . Penicillin G Hives  . Sulfa Antibiotics Nausea And Vomiting and Rash    Medications: Current Outpatient Medications on File Prior to Visit  Medication Sig Dispense Refill  . aspirin EC 81 MG tablet Take by mouth.    . calcium-vitamin D (OSCAL WITH D) 500-200 MG-UNIT tablet Take 1 tablet by mouth daily at 2 PM.    . Cholecalciferol (VITAMIN D) 2000 units tablet Take by mouth.    .  hydrochlorothiazide (HYDRODIURIL) 12.5 MG tablet TAKE 1 TABLET (12.5 MG TOTAL) BY MOUTH ONCE DAILY.    . metoprolol succinate (TOPROL-XL) 25 MG 24 hr tablet Take by mouth.    . Multiple Vitamins-Minerals (MULTIVITAMIN WOMEN PO) Take 1 tablet by mouth daily at 2 PM.    . pantoprazole (PROTONIX) 40 MG tablet     . ondansetron (ZOFRAN ODT) 4 MG disintegrating tablet Take 1 tablet (4 mg total) by mouth every 8 (eight) hours as needed for nausea or vomiting. 20 tablet 0  . potassium chloride (K-DUR) 10 MEQ tablet Take 1 tablet (10 mEq total) by mouth daily. 5 tablet 0   No current facility-administered medications on file prior to visit.  Physical Exam Vitals: BP 128/74 (BP Location: Right Arm, Patient Position: Sitting, Cuff Size: Normal)   Pulse 70   Ht 5\' 2"  (1.575 m)   Wt 63 kg   BMI 25.42 kg/m   General: pleasant female in NAD HEENT: normocephalic, anicteric Thyroid: no enlargement, no palpable nodules Pulmonary: No increased work of breathing, CTAB Cardiovascular: RRR, without murmur Breast: Breast symmetrical, no tenderness, no palpable nodules or masses, no skin or nipple retraction present, no nipple discharge.  No axillary, infraclavicular,  or supraclavicular lymphadenopathy. Abdomen: soft, non-tender, non-distended.  Umbilicus without lesions.  No hepatomegaly or masses palpable. No evidence of hernia  Genitourinary:  External: Normal external female genitalia.  Normal urethral meatus, normal Bartholin's and Skene's glands.  Tiny tear shaped brown macule on right labia majora  Vagina: Atrophic vaginal mucosa, no evidence of prolapse.    Cervix: surgically absent  Uterus: surgically absent  Adnexa: no adnexal masses, NT  Rectal: deferred  Lymphatic: no evidence of inguinal lymphadenopathy Extremities: no edema, erythema, or tenderness Neurologic: Grossly intact Psychiatric: mood appropriate, affect full  Results for orders placed or performed in visit on 09/11/18  (from the past 24 hour(s))  POCT Urinalysis Dipstick     Status: Abnormal   Collection Time: 09/11/18  9:03 AM  Result Value Ref Range   Color, UA yellow    Clarity, UA clear    Glucose, UA Negative Negative   Bilirubin, UA neg    Ketones, UA neg    Spec Grav, UA 1.010 1.010 - 1.025   Blood, UA neg    pH, UA 6.0 5.0 - 8.0   Protein, UA Negative Negative   Urobilinogen, UA 0.2 0.2 or 1.0 E.U./dL   Nitrite, UA neg    Leukocytes, UA Small (1+) (A) Negative   Appearance normal    Odor normal     Assessment: 58 y.o. [id number] well woman exam Hx of TLH and BSO for fibroids Intermittent dysuria.   Plan:   1) Mammogram - recommend yearly screening mammogram.  Patient to schedule her screening mammogram at [facility]   2) ASCCP guidelines and rational discussed.  Patient opts for every 3 year screening interval  3) Routine healthcare maintenance including cholesterol, diabetes screening discussed managed by PCP . Urine culture sent  4)Colon cancer screening: Colonoscopy: due in 2024  5) Osteoporosis prevention: Adequate calcium and vitamin D3 intake/ exercise. Consider repeat Dexa scan next year.  6) RTO in 1 year and prn  Dalia Heading, [place] 09/11/18

## 2018-09-13 LAB — URINE CULTURE: Organism ID, Bacteria: NO GROWTH

## 2018-09-22 ENCOUNTER — Ambulatory Visit
Admission: RE | Admit: 2018-09-22 | Discharge: 2018-09-22 | Disposition: A | Discharge status: Home / Self Care | Payer: 59 | Source: Ambulatory Visit | Attending: Certified Nurse Midwife | Admitting: Certified Nurse Midwife

## 2018-09-22 ENCOUNTER — Other Ambulatory Visit: Payer: Self-pay

## 2018-09-22 DIAGNOSIS — Z1231 Encounter for screening mammogram for malignant neoplasm of breast: Secondary | ICD-10-CM | POA: Insufficient documentation

## 2018-09-22 DIAGNOSIS — Z1239 Encounter for other screening for malignant neoplasm of breast: Secondary | ICD-10-CM

## 2018-10-25 ENCOUNTER — Other Ambulatory Visit: Payer: Self-pay | Admitting: Otolaryngology

## 2018-10-25 DIAGNOSIS — H9122 Sudden idiopathic hearing loss, left ear: Secondary | ICD-10-CM

## 2018-11-06 ENCOUNTER — Ambulatory Visit: Admission: RE | Admit: 2018-11-06 | Payer: 59 | Source: Ambulatory Visit

## 2018-11-27 ENCOUNTER — Ambulatory Visit
Admission: RE | Admit: 2018-11-27 | Discharge: 2018-11-27 | Disposition: A | Discharge status: Home / Self Care | Payer: 59 | Source: Ambulatory Visit | Attending: Otolaryngology | Admitting: Otolaryngology

## 2018-11-27 ENCOUNTER — Other Ambulatory Visit: Payer: Self-pay

## 2018-11-27 DIAGNOSIS — H9122 Sudden idiopathic hearing loss, left ear: Secondary | ICD-10-CM | POA: Diagnosis present

## 2018-11-27 LAB — POCT I-STAT CREATININE: Creatinine, Ser: 0.9 mg/dL (ref 0.44–1.00)

## 2018-11-27 MED ORDER — GADOBUTROL 1 MMOL/ML IV SOLN
6.0000 mL | Freq: Once | INTRAVENOUS | Status: AC | PRN
  Administered 2018-11-27: 6 mL via INTRAVENOUS

## 2018-12-05 DIAGNOSIS — U071 COVID-19: Secondary | ICD-10-CM

## 2018-12-05 HISTORY — DX: COVID-19: U07.1

## 2019-01-09 DIAGNOSIS — H9190 Unspecified hearing loss, unspecified ear: Secondary | ICD-10-CM | POA: Insufficient documentation

## 2019-09-21 ENCOUNTER — Other Ambulatory Visit: Payer: Self-pay

## 2019-09-21 ENCOUNTER — Encounter: Payer: Self-pay | Admitting: Certified Nurse Midwife

## 2019-09-21 ENCOUNTER — Ambulatory Visit (INDEPENDENT_AMBULATORY_CARE_PROVIDER_SITE_OTHER): Payer: 59 | Admitting: Certified Nurse Midwife

## 2019-09-21 ENCOUNTER — Other Ambulatory Visit (HOSPITAL_COMMUNITY)
Admission: RE | Admit: 2019-09-21 | Discharge: 2019-09-21 | Disposition: A | Discharge status: Home / Self Care | Payer: 59 | Source: Ambulatory Visit | Attending: Certified Nurse Midwife | Admitting: Certified Nurse Midwife

## 2019-09-21 VITALS — BP 110/72 | HR 65 | Resp 16 | Ht 62.0 in | Wt 131.4 lb

## 2019-09-21 DIAGNOSIS — Z01419 Encounter for gynecological examination (general) (routine) without abnormal findings: Secondary | ICD-10-CM

## 2019-09-21 DIAGNOSIS — Z1272 Encounter for screening for malignant neoplasm of vagina: Secondary | ICD-10-CM | POA: Diagnosis not present

## 2019-09-21 DIAGNOSIS — Z13820 Encounter for screening for osteoporosis: Secondary | ICD-10-CM

## 2019-09-21 DIAGNOSIS — Z1231 Encounter for screening mammogram for malignant neoplasm of breast: Secondary | ICD-10-CM

## 2019-09-21 NOTE — Progress Notes (Signed)
Gynecology Annual Exam  PCP: Dr Marcelino Duster Chief Complaint:  Chief Complaint  Patient presents with  . Gynecologic Exam    History of Present Illness: Karen Maldonado presents today for her annual exam. She is a 59 year old African American/Black female , G 2 P 2 0 0 2 , who is postmenopausal . She presents for her annual exam. She has had no spotting. She reports some hot flashes occaionally.  The patient's past medical history is notable for a history of a TLH and BSO in 2006 for fibroids.. She has never used HT. She also has hypertension and a goiter  and GERD. She has sensorineural deafness and was diagnosed with fibromuscular dysplasia of the cervicocranial artery.. Since her last annual GYN exam dated 09/11/2018, she has had a Covid 19 infection (Sept 2020) and has received her Moderna vaccines x 2.  She was laid off from work  due to the Covid 19 pandemic. She has had    Her most recent pap smear was obtained 07/12/2016 and was NIL Her most recent mammogram obtained on 09/22/2018 was normal and revealed no significant changes. There is no family history of breast cancer. There is no family history of ovarian cancer. The patient does do occasional self breast exams.  She had a colonoscopy in 2013 that was normal. Her next colonoscopy is due in 10 years.  She denies a recent DEXA scan 03/13/2015 revealed osteopenia (Femur T score=-1.6) The patient does not smoke.  The patient does not drink alcohol.  The patient does not use illegal drugs.  The patient does exercise by walking most days of the week in her yard The patient does get adequate calcium in her diet and with her calcium supplement. She also takes vitamin D3 supplements. Her routine screening blood work is managed by her PCP.Her lipid panel in 2021 was borderline and her hemoglobin A1C was 6.0%     Review of Systems: Review of Systems  Constitutional: Positive for weight loss (intentional). Negative for chills  and fever.  HENT: Negative for congestion, sinus pain and sore throat.   Eyes: Negative for blurred vision and pain.  Respiratory: Negative for hemoptysis, shortness of breath and wheezing.   Cardiovascular: Negative for chest pain, palpitations and leg swelling.  Gastrointestinal: Negative for abdominal pain, blood in stool, diarrhea, heartburn, nausea and vomiting.  Genitourinary: Negative for dysuria, frequency, hematuria and urgency.  Musculoskeletal: Negative for back pain, joint pain and myalgias.  Skin: Negative for itching and rash.  Neurological: Negative for dizziness, tingling and headaches.  Endo/Heme/Allergies: Negative for environmental allergies and polydipsia. Does not bruise/bleed easily.       Negative for hirsutism   Psychiatric/Behavioral: Negative for depression. The patient is not nervous/anxious and does not have insomnia.     Past Medical History:  Past Medical History:  Diagnosis Date  . Allergic rhinitis 07/05/2011  . COVID-19 virus infection 12/2018  . DDD (degenerative disc disease), cervical 05/09/2012  . Deafness, sensorineural 01/05/2012  . Dizziness 06/24/2011   Overview:  negative ENT work-up Negative cardiac work-up including Holter, ECHO and stress test   . Essential (primary) hypertension 02/04/2014  . Fibromuscular dysplasia of cervicocranial artery (HCC)   . Gastro-esophageal reflux disease without esophagitis 11/19/2013  . GERD (gastroesophageal reflux disease)   . Goiter   . Hematuria    microscopic  . Infection of the upper respiratory tract 07/05/2011  . Near syncope 11/02/2013  . Prediabetes  Past Surgical History:  Past Surgical History:  Procedure Laterality Date  . CHOLECYSTECTOMY    . COLONOSCOPY  2013  . ESOPHAGOGASTRODUODENOSCOPY     10/24/2003, 03/18/2009, 09/06/2013  . TOTAL ABDOMINAL HYSTERECTOMY  2006   TLH-BSO for fibroids Dr. Luella Cook    Family History:  Family History  Problem Relation Age of Onset  . Stroke Father   .  Hypertension Father   . Dementia Father   . Hypertension Mother   . Stroke Sister   . Stroke Sister   . Breast cancer Neg Hx     Social History:  Social History   Socioeconomic History  . Marital status: Married    Spouse name: Not on file  . Number of children: 2  . Years of education: 40  . Highest education level: Not on file  Occupational History  . Occupation: currently unemployed  Tobacco Use  . Smoking status: Never Smoker  . Smokeless tobacco: Never Used  Substance and Sexual Activity  . Alcohol use: No    Alcohol/week: 0.0 standard drinks  . Drug use: No  . Sexual activity: Not Currently  Other Topics Concern  . Not on file  Social History Narrative  . Not on file   Social Determinants of Health   Financial Resource Strain:   . Difficulty of Paying Living Expenses:   Food Insecurity:   . Worried About Programme researcher, broadcasting/film/video in the Last Year:   . Barista in the Last Year:   Transportation Needs:   . Freight forwarder (Medical):   Marland Kitchen Lack of Transportation (Non-Medical):   Physical Activity:   . Days of Exercise per Week:   . Minutes of Exercise per Session:   Stress:   . Feeling of Stress :   Social Connections:   . Frequency of Communication with Friends and Family:   . Frequency of Social Gatherings with Friends and Family:   . Attends Religious Services:   . Active Member of Clubs or Organizations:   . Attends Banker Meetings:   Marland Kitchen Marital Status:   Intimate Partner Violence:   . Fear of Current or Ex-Partner:   . Emotionally Abused:   Marland Kitchen Physically Abused:   . Sexually Abused:     Allergies:  Allergies  Allergen Reactions  . Penicillin G Hives  . Sulfa Antibiotics Nausea And Vomiting and Rash    Medications: Current Outpatient Medications on File Prior to Visit  Medication Sig Dispense Refill  . ascorbic acid (VITAMIN C) 500 MG tablet Take 500 mg by mouth daily.    Marland Kitchen aspirin EC 81 MG tablet Take by mouth.    .  calcium-vitamin D (OSCAL WITH D) 500-200 MG-UNIT tablet Take 1 tablet by mouth daily at 2 PM.    . Cholecalciferol (VITAMIN D) 2000 units tablet Take by mouth.    . hydrochlorothiazide (HYDRODIURIL) 12.5 MG tablet TAKE 1 TABLET (12.5 MG TOTAL) BY MOUTH ONCE DAILY.    . metoprolol succinate (TOPROL-XL) 25 MG 24 hr tablet Take by mouth.    . Multiple Vitamins-Minerals (MULTIVITAMIN WOMEN PO) Take 1 tablet by mouth daily at 2 PM.    . pantoprazole (PROTONIX) 40 MG tablet      No current facility-administered medications on file prior to visit.      Physical Exam Vitals: BP 110/72   Pulse 65   Resp 16   Ht 5\' 2"  (1.575 m)   Wt 59.6 kg   SpO2 98%  BMI 24.03 kg/m    General: pleasant female in NAD HEENT: normocephalic, anicteric Thyroid: right lobe enlarged, no palpable nodules Pulmonary: No increased work of breathing, CTAB Cardiovascular: RRR, without murmur Breast: Breast symmetrical, no tenderness, no palpable nodules or masses, no skin or nipple retraction present, no nipple discharge.  No axillary, infraclavicular,  or supraclavicular lymphadenopathy. Abdomen: soft, non-tender, non-distended.  Umbilicus without lesions.  No hepatomegaly or masses palpable. No evidence of hernia  Genitourinary:  External: Normal external female genitalia.  Normal urethral meatus, normal Bartholin's and Skene's glands.  Tiny tear shaped brown macule on right labia majora  Vagina: Atrophic vaginal mucosa, no evidence of prolapse.    Cervix: surgically absent  Uterus: surgically absent  Adnexa: no adnexal masses, NT  Rectal: deferred  Lymphatic: no evidence of inguinal lymphadenopathy Extremities: no edema, erythema, or tenderness Neurologic: Grossly intact Psychiatric: mood appropriate, affect full   Assessment: 59 y.o. [id number] well woman exam Hx of TLH and BSO for fibroids Hx of goiter   Plan:   1) Mammogram - recommend yearly screening mammogram.  Patient to schedule her screening  mammogram at [facility]   2) Pap smear done. ASCCP guidelines and rational discussed.  Patient opts for every 3 year screening interval  3) Routine healthcare maintenance including cholesterol, diabetes screening discussed managed by PCP . Urine culture sent  4)Colon cancer screening: Colonoscopy: due in 2023  5) Osteoporosis prevention: Adequate calcium and vitamin D3 intake/ exercise. Will schedule screening DEXA  6) RTO in 1 year and prn  Dalia Heading, [place] 09/21/19

## 2019-09-24 ENCOUNTER — Encounter: Payer: Self-pay | Admitting: Certified Nurse Midwife

## 2019-09-25 ENCOUNTER — Telehealth: Payer: Self-pay | Admitting: Certified Nurse Midwife

## 2019-09-25 NOTE — Telephone Encounter (Signed)
-----   Message from Farrel Conners, [place] sent at 09/24/2019  7:39 PM EDT ----- Regarding: Schedule DEXA Please schedule screening DEXA at [facility]. Thanks, [employer]

## 2019-09-25 NOTE — Telephone Encounter (Signed)
Thank you :)

## 2019-09-25 NOTE — Telephone Encounter (Signed)
L/M for pt adv that I was able to schedule her MMG and DEXA at [facility] on 7/6 @ 11:00am. I adv that if there was a conflict she could call them at [phone] to reschedule.

## 2019-09-28 LAB — CYTOLOGY - PAP
Comment: NEGATIVE
Diagnosis: HIGH — AB
High risk HPV: NEGATIVE

## 2019-10-02 ENCOUNTER — Encounter: Payer: Self-pay | Admitting: Certified Nurse Midwife

## 2019-10-09 ENCOUNTER — Ambulatory Visit
Admission: RE | Admit: 2019-10-09 | Discharge: 2019-10-09 | Disposition: A | Discharge status: Home / Self Care | Payer: BLUE CROSS/BLUE SHIELD | Source: Ambulatory Visit | Attending: Certified Nurse Midwife | Admitting: Certified Nurse Midwife

## 2019-10-09 DIAGNOSIS — Z13820 Encounter for screening for osteoporosis: Secondary | ICD-10-CM

## 2019-10-09 DIAGNOSIS — Z1231 Encounter for screening mammogram for malignant neoplasm of breast: Secondary | ICD-10-CM | POA: Diagnosis present

## 2020-11-13 ENCOUNTER — Other Ambulatory Visit: Payer: Self-pay | Admitting: Internal Medicine

## 2020-11-13 DIAGNOSIS — Z1231 Encounter for screening mammogram for malignant neoplasm of breast: Secondary | ICD-10-CM

## 2020-11-27 ENCOUNTER — Ambulatory Visit
Admission: RE | Admit: 2020-11-27 | Discharge: 2020-11-27 | Disposition: A | Discharge status: Home / Self Care | Payer: BLUE CROSS/BLUE SHIELD | Source: Ambulatory Visit | Attending: Internal Medicine | Admitting: Internal Medicine

## 2020-11-27 ENCOUNTER — Other Ambulatory Visit: Payer: Self-pay

## 2020-11-27 DIAGNOSIS — Z1231 Encounter for screening mammogram for malignant neoplasm of breast: Secondary | ICD-10-CM | POA: Diagnosis not present

## 2020-12-30 IMAGING — MG DIGITAL SCREENING BILAT W/ TOMO W/ CAD
6 of 10 series · 6 of 30 positions shown · non-contrast
Comparison: Previous exam(s).

CLINICAL DATA: Screening.

EXAM:
DIGITAL SCREENING BILATERAL MAMMOGRAM WITH TOMO AND CAD

[L MLO synth-2D (1 of 2)]
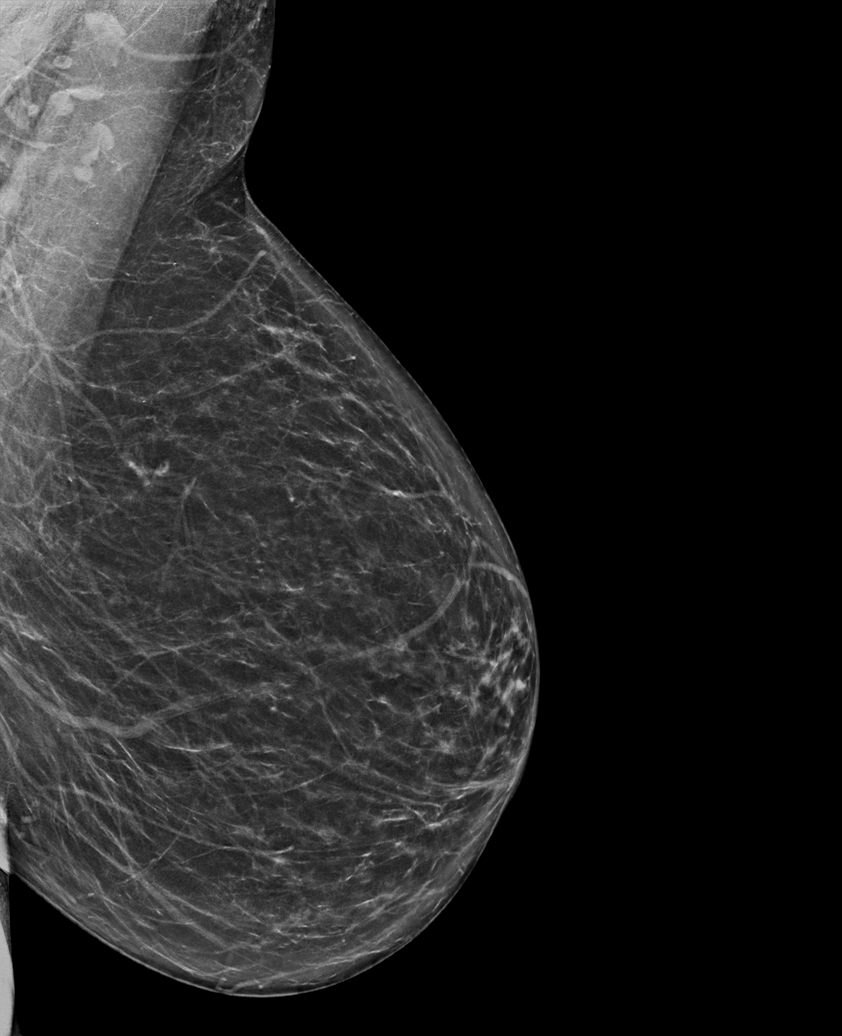

[R MLO synth-2D]
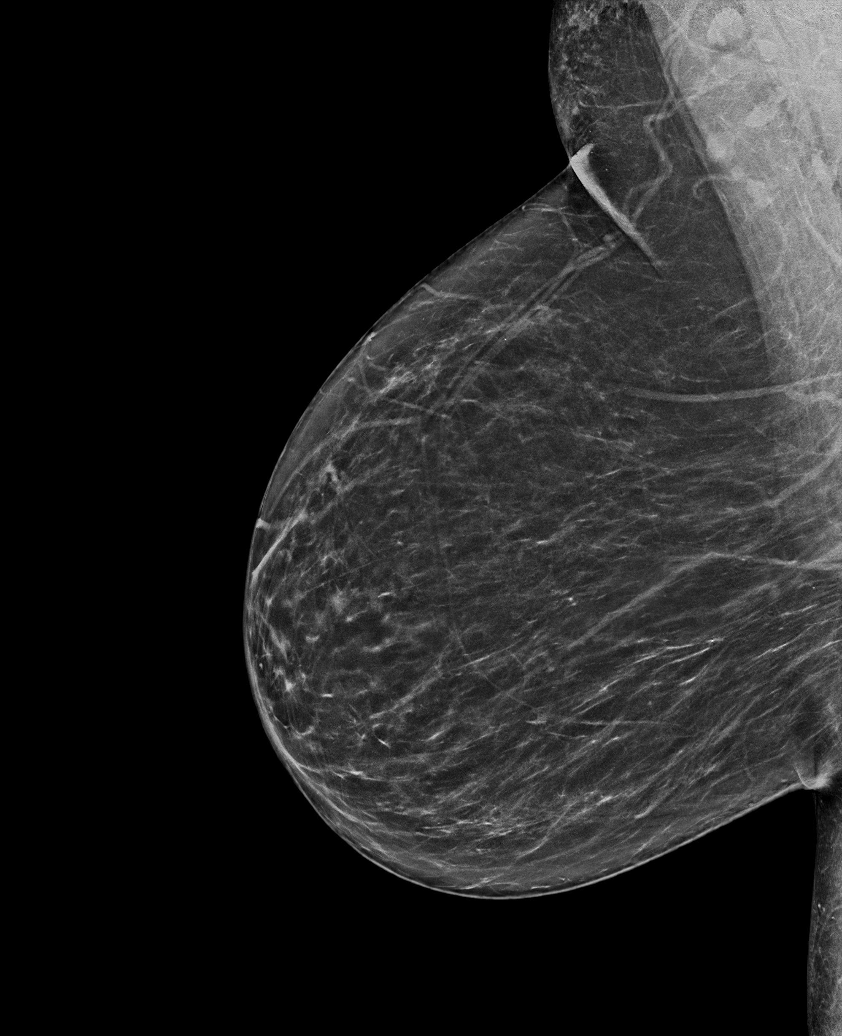

[R CC synth-2D]
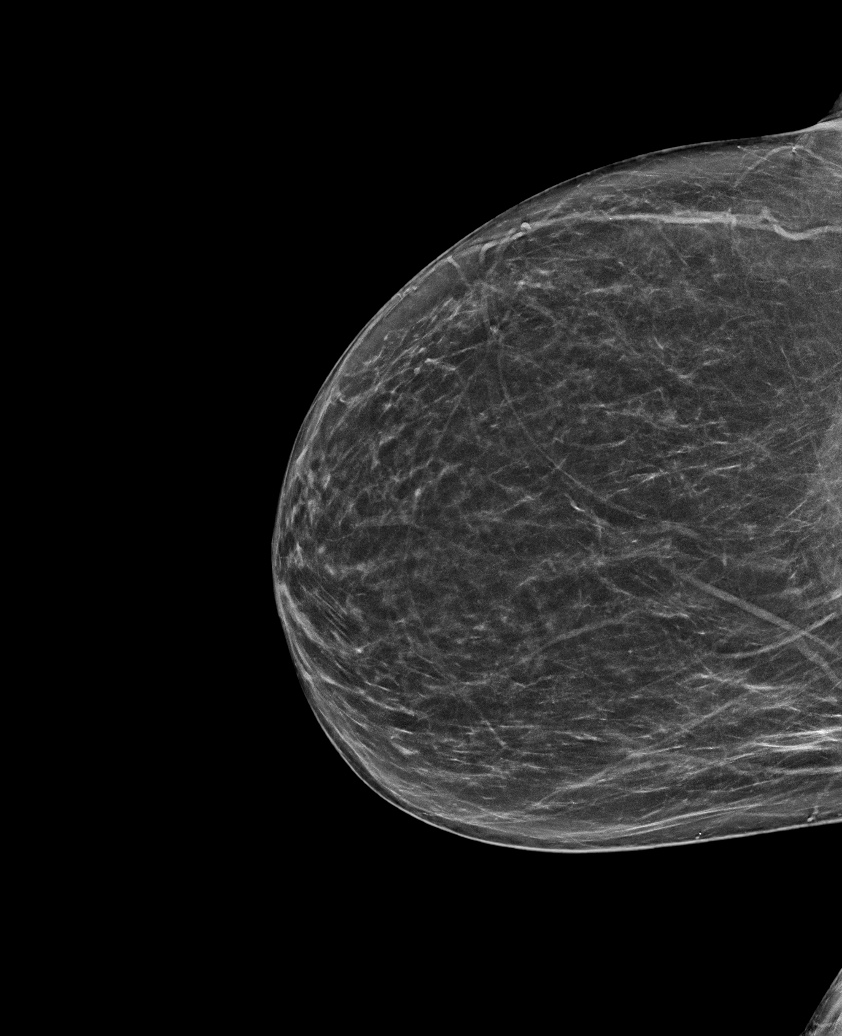

[L MLO synth-2D (2 of 2)]
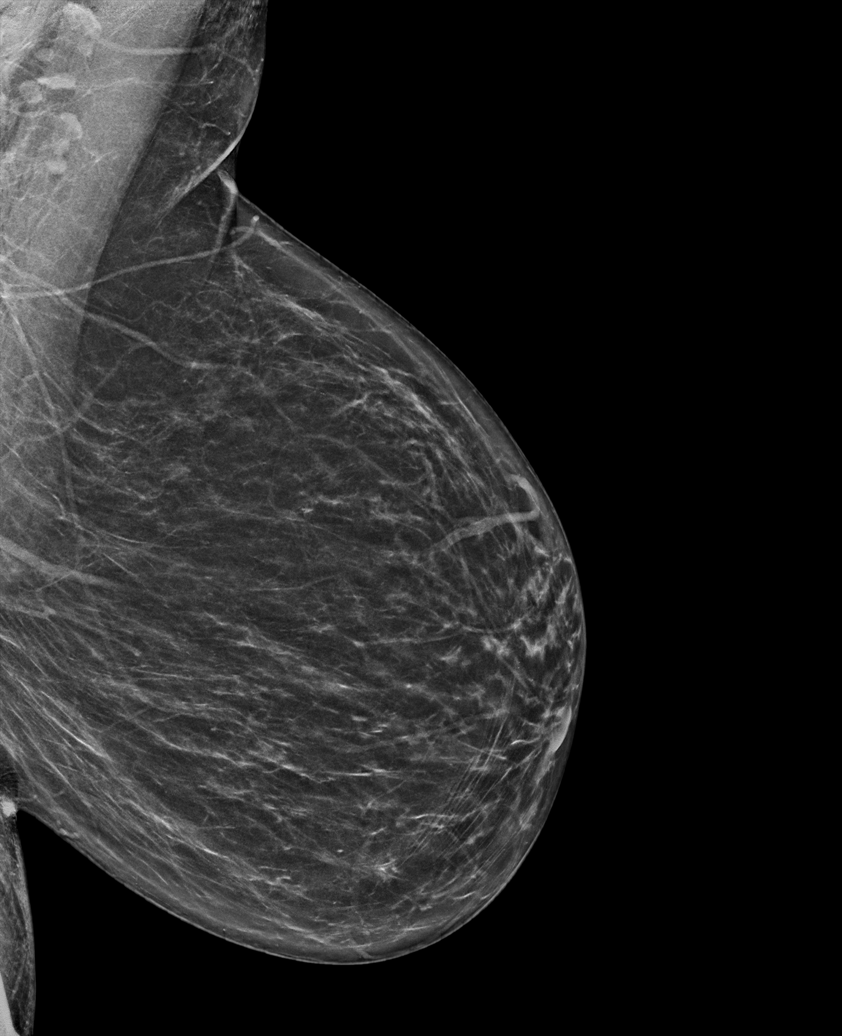

[L CC synth-2D]
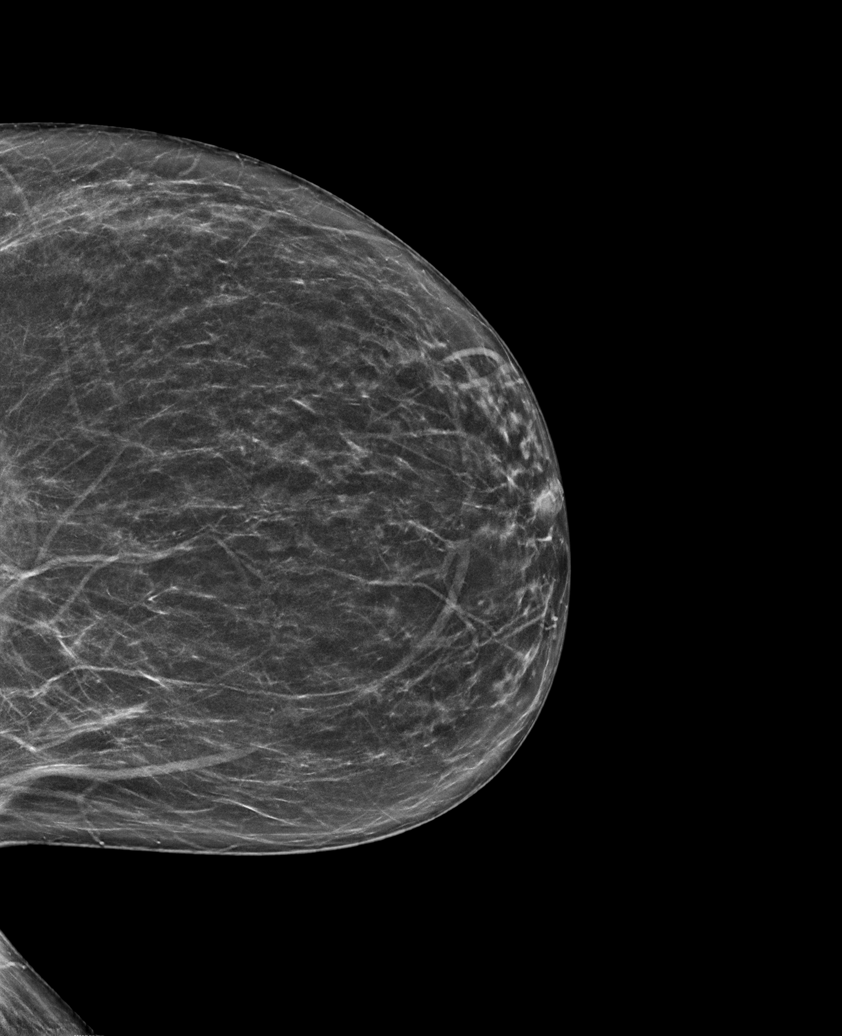

[R MLO tomo · tomo slice 35/70.0]
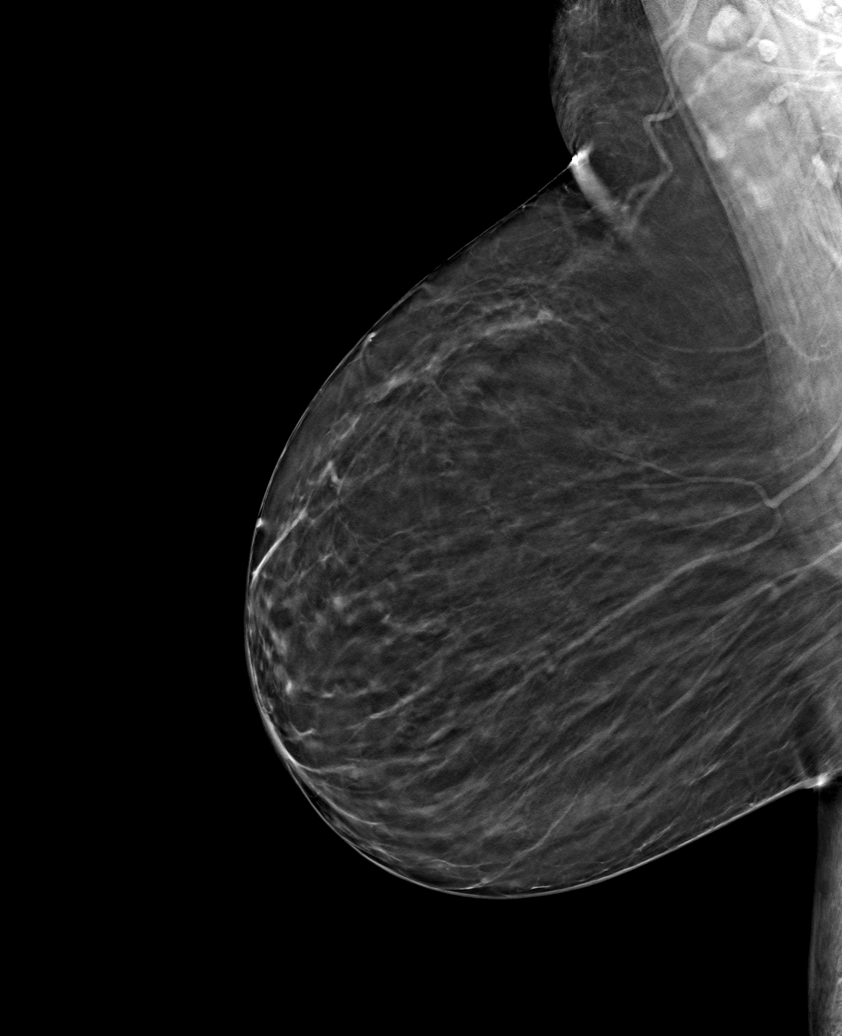

[6 of 30 positions shown; findings below may reference images not displayed]

ACR Breast Density Category b: There are scattered areas of
fibroglandular density.
FINDINGS: There are no findings suspicious for malignancy. Images were
processed with CAD.
IMPRESSION: No mammographic evidence of malignancy. A result letter of this
screening mammogram will be mailed directly to the patient.

RECOMMENDATION:
Screening mammogram in one year. (Code:[id number])

BI-RADS CATEGORY  1: Negative.

## 2021-01-16 ENCOUNTER — Emergency Department (HOSPITAL_COMMUNITY): Payer: BLUE CROSS/BLUE SHIELD

## 2021-01-16 ENCOUNTER — Encounter (HOSPITAL_COMMUNITY): Payer: Self-pay | Admitting: *Deleted

## 2021-01-16 ENCOUNTER — Other Ambulatory Visit: Payer: Self-pay

## 2021-01-16 ENCOUNTER — Emergency Department (HOSPITAL_COMMUNITY)
Admission: EM | Admit: 2021-01-16 | Discharge: 2021-01-16 | Disposition: A | Discharge status: Home / Self Care | Payer: BLUE CROSS/BLUE SHIELD | Attending: Emergency Medicine | Admitting: Emergency Medicine

## 2021-01-16 DIAGNOSIS — W19XXXA Unspecified fall, initial encounter: Secondary | ICD-10-CM | POA: Insufficient documentation

## 2021-01-16 DIAGNOSIS — Z79899 Other long term (current) drug therapy: Secondary | ICD-10-CM | POA: Diagnosis not present

## 2021-01-16 DIAGNOSIS — S92032A Displaced avulsion fracture of tuberosity of left calcaneus, initial encounter for closed fracture: Secondary | ICD-10-CM | POA: Diagnosis not present

## 2021-01-16 DIAGNOSIS — Z7982 Long term (current) use of aspirin: Secondary | ICD-10-CM | POA: Diagnosis not present

## 2021-01-16 DIAGNOSIS — T148XXA Other injury of unspecified body region, initial encounter: Secondary | ICD-10-CM

## 2021-01-16 DIAGNOSIS — Y92009 Unspecified place in unspecified non-institutional (private) residence as the place of occurrence of the external cause: Secondary | ICD-10-CM | POA: Diagnosis not present

## 2021-01-16 DIAGNOSIS — Z8616 Personal history of COVID-19: Secondary | ICD-10-CM | POA: Insufficient documentation

## 2021-01-16 DIAGNOSIS — M25512 Pain in left shoulder: Secondary | ICD-10-CM | POA: Insufficient documentation

## 2021-01-16 DIAGNOSIS — S99912A Unspecified injury of left ankle, initial encounter: Secondary | ICD-10-CM | POA: Diagnosis present

## 2021-01-16 DIAGNOSIS — I10 Essential (primary) hypertension: Secondary | ICD-10-CM | POA: Insufficient documentation

## 2021-01-16 DIAGNOSIS — M25572 Pain in left ankle and joints of left foot: Secondary | ICD-10-CM

## 2021-01-16 MED ORDER — ACETAMINOPHEN 325 MG PO TABS
650.0000 mg | ORAL_TABLET | Freq: Once | ORAL | Status: AC
  Administered 2021-01-16: 650 mg via ORAL
  Filled 2021-01-16: qty 2

## 2021-01-16 NOTE — Discharge Instructions (Addendum)
Call your orthopedic doctor as discussed in the next 2-3 days.   Return immediately back to the ER if:  Your symptoms worsen within the next 12-24 hours. You develop new symptoms such as new fevers, persistent vomiting, new pain, shortness of breath, or new weakness or numbness, or if you have any other concerns.

## 2021-01-16 NOTE — ED Provider Notes (Signed)
[facility] EMERGENCY DEPARTMENT Provider Note   CSN: [id number] Arrival date & time: 01/16/21  1312     History Chief Complaint  Patient presents with   Liana Crocker Chaniyah Jahr is a 60 y.o. female.  Presents chief complaint of left ankle pain and left shoulder pain.  She states that she was outside when she tripped and fell onto her left side.  Denies loss of consciousness.  Denies chest pain or abdominal pain.  Triage notes state headache, however patient denies.  Said after she fell she felt cold and clammy felt nauseous and felt lightheaded.  Currently symptoms have resolved other than her left ankle pain.      Past Medical History:  Diagnosis Date   Allergic rhinitis 07/05/2011   COVID-19 virus infection 12/2018   DDD (degenerative disc disease), cervical 05/09/2012   Deafness, sensorineural 01/05/2012   Dizziness 06/24/2011   Overview:  negative ENT work-up Negative cardiac work-up including Holter, ECHO and stress test    Essential (primary) hypertension 02/04/2014   Fibromuscular dysplasia of cervicocranial artery (HCC)    Gastro-esophageal reflux disease without esophagitis 11/19/2013   GERD (gastroesophageal reflux disease)    Goiter    Hematuria    microscopic   Infection of the upper respiratory tract 07/05/2011   Near syncope 11/02/2013   Prediabetes     Patient Active Problem List   Diagnosis Date Noted   Mild acquired hearing loss 01/09/2019   Prediabetes 10/31/2016   Fibromuscular dysplasia of cervicocranial artery (HCC) 10/29/2016   Osteopenia of multiple sites 04/26/2016   Pure hypercholesterolemia 03/24/2015   Adaptive colitis 12/19/2014   Microscopic hematuria 12/19/2014   Atrophic vaginitis 12/19/2014   Benign essential HTN 11/16/2014   Unintended weight gain 11/16/2014   Chest pain 07/17/2014   Gastro-esophageal reflux disease without esophagitis 11/19/2013   Near syncope 11/02/2013   Accumulation of fluid in tissues 08/25/2012   DDD  (degenerative disc disease), cervical 05/09/2012   HLD (hyperlipidemia) 05/09/2012   Elevated fasting blood sugar 05/09/2012   Avitaminosis D 05/09/2012   Impaired fasting glucose 05/09/2012   Deafness, sensorineural 01/05/2012   Allergic rhinitis 07/05/2011   Infection of the upper respiratory tract 07/05/2011   Abdominal pain, epigastric 06/24/2011   Dizziness 06/24/2011    Past Surgical History:  Procedure Laterality Date   CHOLECYSTECTOMY     COLONOSCOPY  2013   ESOPHAGOGASTRODUODENOSCOPY     10/24/2003, 03/18/2009, 09/06/2013   TOTAL ABDOMINAL HYSTERECTOMY  2006   [facility] for fibroids Dr. Luella Cook     OB History     Gravida  2   Para  2   Term  2   Preterm      AB      Living  2      SAB      IAB      Ectopic      Multiple      Live Births  2           Family History  Problem Relation Age of Onset   Stroke Father    Hypertension Father    Dementia Father    Hypertension Mother    Stroke Sister    Stroke Sister    Breast cancer Neg Hx     Social History   Tobacco Use   Smoking status: Never   Smokeless tobacco: Never  Substance Use Topics   Alcohol use: No    Alcohol/week: 0.0 standard drinks   Drug use:  No    Home Medications Prior to Admission medications   Medication Sig Start Date End Date Taking? Authorizing Provider  ascorbic acid (VITAMIN C) 500 MG tablet Take 500 mg by mouth daily.    [provider]  aspirin EC 81 MG tablet Take by mouth.    [provider]  calcium-vitamin D (OSCAL WITH D) 500-200 MG-UNIT tablet Take 1 tablet by mouth daily at 2 PM.    [provider]  Cholecalciferol (VITAMIN D) 2000 units tablet Take by mouth.    [provider]  hydrochlorothiazide (HYDRODIURIL) 12.5 MG tablet TAKE 1 TABLET (12.5 MG TOTAL) BY MOUTH ONCE DAILY. 11/27/15   [provider]  metoprolol succinate (TOPROL-XL) 25 MG 24 hr tablet Take by mouth. 03/24/15   [provider]   Multiple Vitamins-Minerals (MULTIVITAMIN WOMEN PO) Take 1 tablet by mouth daily at 2 PM.    [provider]  pantoprazole (PROTONIX) 40 MG tablet  06/30/16   [provider]    Allergies    Penicillin g and Sulfa antibiotics  Review of Systems   Review of Systems  Constitutional:  Negative for fever.  HENT:  Negative for ear pain.   Eyes:  Negative for pain.  Respiratory:  Negative for cough.   Cardiovascular:  Negative for chest pain.  Gastrointestinal:  Negative for abdominal pain.  Genitourinary:  Negative for flank pain.  Musculoskeletal:  Negative for back pain.  Skin:  Negative for rash.  Neurological:  Negative for headaches.   Physical Exam Updated Vital Signs BP (!) 162/78   Pulse 77   Temp 98.1 F (36.7 C) (Oral)   Resp 18   SpO2 98%   Physical Exam Constitutional:      General: She is not in acute distress.    Appearance: Normal appearance.  HENT:     Head: Normocephalic.     Comments: No abrasions contusions or tenderness on scalp exam.    Nose: Nose normal.  Eyes:     Extraocular Movements: Extraocular movements intact.  Cardiovascular:     Rate and Rhythm: Normal rate.  Pulmonary:     Effort: Pulmonary effort is normal.  Musculoskeletal:     Cervical back: Normal range of motion.     Comments: Decreased range of motion left ankle tenderness palpation of left ankle lateral aspect.  Normal range of motion of bilateral hips and knees.  Bilateral shoulders elbows wrists normal range of motion.  No C or T or L-spine midline step-offs or tenderness noted.  She is able to range her torso without pain or discomfort.  Neurological:     General: No focal deficit present.     Mental Status: She is alert. Mental status is at baseline.    ED Results / Procedures / Treatments   Labs (all labs ordered are listed, but only abnormal results are displayed) Labs Reviewed - No data to display  EKG   Radiology DG Ankle Complete Left  Result  Date: 01/16/2021 CLINICAL DATA:  Left ankle pain after fall EXAM: LEFT ANKLE COMPLETE - 3+ VIEW COMPARISON:  None. FINDINGS: Small osseous avulsion fragment along the lateral aspect of the distal calcaneus likely reflecting extensor digitorum brevis avulsion fracture. Additional nondisplaced fracture involving the dorsal aspect of the talar head. Ankle mortise is congruent. No dislocation. Mild soft tissue swelling. IMPRESSION: 1. Avulsion fracture along the lateral aspect of the distal calcaneus likely reflecting extensor digitorum brevis avulsion fracture. 2. Nondisplaced fracture involving the dorsal aspect of  the talar head. Electronically Signed   By: Duanne Guess D.O.   On: 01/16/2021 14:35   DG Shoulder Left  Result Date: 01/16/2021 CLINICAL DATA:  Fall, left shoulder pain EXAM: LEFT SHOULDER - 2+ VIEW COMPARISON:  None. FINDINGS: There is no evidence of fracture or dislocation. There is no evidence of arthropathy or other focal bone abnormality. Soft tissues are unremarkable. IMPRESSION: Negative. Electronically Signed   By: Duanne Guess D.O.   On: 01/16/2021 14:36   DG Hip Unilat With Pelvis 2-3 Views Left  Result Date: 01/16/2021 CLINICAL DATA:  Fall, posterior left hip pain EXAM: DG HIP (WITH OR WITHOUT PELVIS) 2-3V LEFT COMPARISON:  None. FINDINGS: There is no evidence of hip fracture or dislocation. There is no evidence of arthropathy or other focal bone abnormality. IMPRESSION: Negative. Electronically Signed   By: Duanne Guess D.O.   On: 01/16/2021 14:38    Procedures .Ortho Injury Treatment  Date/Time: 01/16/2021 4:26 PM Performed by: Cheryll Cockayne, MD Authorized by: Cheryll Cockayne, MD  Immobilization: splint Post-procedure neurovascular assessment: post-procedure neurovascularly intact Comments: Air splint to left lower extremity placed.     Medications Ordered in ED Medications  acetaminophen (TYLENOL) tablet 650 mg (650 mg Oral Given 01/16/21 1455)    ED  Course  I have reviewed the triage vital signs and the nursing notes.  Pertinent labs & imaging results that were available during my care of the patient were reviewed by me and considered in my medical decision making (see chart for details).    MDM Rules/Calculators/A&P                           X-rays concerning for avulsion fracture of the left ankle.  Patient is placed in an Aircast splint, given crutch training, advised outpatient follow-up orthopedics within the week.  EKG otherwise shows sinus rhythm normal rate no ST elevations depressions noted.  Normal rhythm normal rate.  Advising immediate return for worsening symptoms pain fevers or any additional concerns.  Final Clinical Impression(s) / ED Diagnoses Final diagnoses:  Acute left ankle pain  Avulsion fracture    Rx / DC Orders ED Discharge Orders     None        Cheryll Cockayne, MD 01/16/21 1627

## 2021-01-16 NOTE — ED Triage Notes (Signed)
Fell at home, pain in left ankle, left shoulder and left side of head, no LOC. STATES SHE TRIPPED AND FELL

## 2021-11-11 ENCOUNTER — Encounter: Payer: Self-pay | Admitting: Urology

## 2021-11-11 ENCOUNTER — Ambulatory Visit (INDEPENDENT_AMBULATORY_CARE_PROVIDER_SITE_OTHER): Payer: 59 | Admitting: Urology

## 2021-11-11 VITALS — BP 150/98 | HR 87 | Ht 62.0 in | Wt 126.0 lb

## 2021-11-11 DIAGNOSIS — R350 Frequency of micturition: Secondary | ICD-10-CM

## 2021-11-11 DIAGNOSIS — R3129 Other microscopic hematuria: Secondary | ICD-10-CM

## 2021-11-11 DIAGNOSIS — R102 Pelvic and perineal pain: Secondary | ICD-10-CM | POA: Diagnosis not present

## 2021-11-11 DIAGNOSIS — R310 Gross hematuria: Secondary | ICD-10-CM | POA: Diagnosis not present

## 2021-11-11 DIAGNOSIS — R3915 Urgency of urination: Secondary | ICD-10-CM | POA: Diagnosis not present

## 2021-11-11 LAB — URINALYSIS, COMPLETE
Bilirubin, UA: NEGATIVE
Glucose, UA: NEGATIVE
Ketones, UA: NEGATIVE
Leukocytes,UA: NEGATIVE
Nitrite, UA: NEGATIVE
Protein,UA: NEGATIVE
Specific Gravity, UA: 1.01 (ref 1.005–1.030)
Urobilinogen, Ur: 0.2 mg/dL (ref 0.2–1.0)
pH, UA: 6.5 (ref 5.0–7.5)

## 2021-11-11 LAB — MICROSCOPIC EXAMINATION: Bacteria, UA: NONE SEEN

## 2021-11-11 MED ORDER — GEMTESA 75 MG PO TABS: 75.0000 mg | ORAL_TABLET | Freq: Every day | ORAL | 0 refills | Status: AC

## 2021-11-11 NOTE — Progress Notes (Signed)
11/11/2021 11:42 AM   Karen Maldonado Ridgel Karen Maldonado [birth year] [id number]  Referring provider: No referring provider for this patient  Chief Complaint  Patient presents with   Hematuria    HPI: Karen Maldonado is a 61 y.o. female previously seen here for microhematuria  Negative CTU and cystoscopy in 2014 Last seen and 2016 by Michiel Cowboy 10 UAs since 2016 and only 1 showing microhematuria at 4-10 RBC Denies gross hematuria Complains of vaginal pain at the end of urination, frequency, urgency and dyspareunia No recurrent UTI or urinary incontinence   PMH: Past Medical History:  Diagnosis Date   Allergic rhinitis 07/05/2011   COVID-19 virus infection 12/2018   DDD (degenerative disc disease), cervical 05/09/2012   Deafness, sensorineural 01/05/2012   Dizziness 06/24/2011   Overview:  negative ENT work-up Negative cardiac work-up including Holter, ECHO and stress test    Essential (primary) hypertension 02/04/2014   Fibromuscular dysplasia of cervicocranial artery (HCC)    Gastro-esophageal reflux disease without esophagitis 11/19/2013   GERD (gastroesophageal reflux disease)    Goiter    Hematuria    microscopic   Infection of the upper respiratory tract 07/05/2011   Near syncope 11/02/2013   Prediabetes     Surgical History: Past Surgical History:  Procedure Laterality Date   CHOLECYSTECTOMY     COLONOSCOPY  2013   ESOPHAGOGASTRODUODENOSCOPY     10/24/2003, 03/18/2009, 09/06/2013   TOTAL ABDOMINAL HYSTERECTOMY  2006   [facility] for fibroids Dr. Luella Cook    Home Medications:  Allergies as of 11/11/2021       Reactions   Penicillin G Hives   Sulfa Antibiotics Nausea And Vomiting, Rash        Medication List        Accurate as of November 11, 2021 11:42 AM. If you have any questions, ask your nurse or doctor.          STOP taking these medications    metoprolol succinate 25 MG 24 hr tablet Commonly known as: TOPROL-XL Stopped by: Riki Altes, MD        TAKE these medications    ascorbic acid 500 MG tablet Commonly known as: VITAMIN C Take 500 mg by mouth daily.   aspirin EC 81 MG tablet Take by mouth.   calcium-vitamin D 500-200 MG-UNIT tablet Commonly known as: OSCAL WITH D Take 1 tablet by mouth daily at 2 PM.   hydrochlorothiazide 12.5 MG tablet Commonly known as: HYDRODIURIL TAKE 1 TABLET (12.5 MG TOTAL) BY MOUTH ONCE DAILY.   MULTIVITAMIN WOMEN PO Take 1 tablet by mouth daily at 2 PM.   pantoprazole 40 MG tablet Commonly known as: PROTONIX   Vitamin D 50 MCG (2000 UT) tablet Take by mouth.        Allergies:  Allergies  Allergen Reactions   Penicillin G Hives   Sulfa Antibiotics Nausea And Vomiting and Rash    Family History: Family History  Problem Relation Age of Onset   Stroke Father    Hypertension Father    Dementia Father    Hypertension Mother    Stroke Sister    Stroke Sister    Breast cancer Neg Hx     Social History:  reports that she has never smoked. She has never used smokeless tobacco. She reports that she does not drink alcohol and does not use drugs.   Physical Exam: BP (!) 150/98   Pulse 87   Ht 5\' 2"  (1.575 m)   Wt 126 lb (57.2  kg)   BMI 23.05 kg/m   Constitutional:  Alert and oriented, No acute distress. HEENT: [place] AT Respiratory: Normal respiratory effort, no increased work of breathing. Psychiatric: Normal mood and affect.  Laboratory Data:  Urinalysis Dipstick trace blood/microscopy negative   Assessment & Plan:    1.  Pelvic pain/urinary frequency Trial Gemtesa 75 mg daily Follow-up 1 month for symptom reassessment Cystoscopy if having persistent symptoms  2.  History microhematuria Previous negative evaluation Has not had significant recurrent microhematuria Cystoscopy as above for voiding/pelvic symptoms   Riki Altes, MD  [facility] [address] [place], [place] [address] [phone]

## 2021-12-01 ENCOUNTER — Other Ambulatory Visit: Payer: Self-pay | Admitting: Internal Medicine

## 2021-12-01 ENCOUNTER — Other Ambulatory Visit: Payer: Self-pay | Admitting: Obstetrics and Gynecology

## 2021-12-01 DIAGNOSIS — Z1231 Encounter for screening mammogram for malignant neoplasm of breast: Secondary | ICD-10-CM

## 2021-12-17 ENCOUNTER — Ambulatory Visit: Payer: 59 | Admitting: Urology

## 2021-12-17 ENCOUNTER — Encounter: Payer: Self-pay | Admitting: Urology

## 2021-12-17 VITALS — BP 149/90 | HR 92 | Ht 62.0 in | Wt 126.0 lb

## 2021-12-17 DIAGNOSIS — R3129 Other microscopic hematuria: Secondary | ICD-10-CM | POA: Diagnosis not present

## 2021-12-17 DIAGNOSIS — N941 Unspecified dyspareunia: Secondary | ICD-10-CM

## 2021-12-17 DIAGNOSIS — R102 Pelvic and perineal pain: Secondary | ICD-10-CM

## 2021-12-17 DIAGNOSIS — R3915 Urgency of urination: Secondary | ICD-10-CM

## 2021-12-17 LAB — URINALYSIS, COMPLETE
Bilirubin, UA: NEGATIVE
Glucose, UA: NEGATIVE
Ketones, UA: NEGATIVE
Leukocytes,UA: NEGATIVE
Nitrite, UA: NEGATIVE
Protein,UA: NEGATIVE
Specific Gravity, UA: <1.005 — ABNORMAL LOW (ref 1.005–1.030)
Urobilinogen, Ur: 0.2 mg/dL (ref 0.2–1.0)
pH, UA: 6.5 (ref 5.0–7.5)

## 2021-12-17 LAB — MICROSCOPIC EXAMINATION
Bacteria, UA: NONE SEEN
Epithelial Cells (non renal): NONE SEEN /hpf (ref 0–10)

## 2021-12-17 MED ORDER — AMITRIPTYLINE HCL 10 MG PO TABS: 10.0000 mg | ORAL_TABLET | Freq: Every day | ORAL | 0 refills | Status: DC

## 2021-12-17 NOTE — Progress Notes (Unsigned)
   12/17/21  CC:  Chief Complaint  Patient presents with   Cysto    HPI: Refer to my prior note of 11/11/2021.  Primary complaints were vaginal pain at the end of urination, frequency, urgency and dyspareunia.  She was given a trial of Gemtesa and her frequency, urgency and dysuria resolved.  Still complains of dyspareunia  Blood pressure (!) 149/90, pulse 92, height 5\' 2"  (1.575 m), weight 126 lb (57.2 kg). Atrophic external genitalia with patent urethral meatus  Cystoscopy Procedure Note  Patient identification was confirmed, informed consent was obtained, and patient was prepped using Betadine solution.  Lidocaine jelly was administered per urethral meatus.    Procedure: - Flexible cystoscope introduced, without any difficulty.   - Thorough search of the bladder revealed:    normal urethral meatus    normal urothelium    no stones    no ulcers     no tumors    no urethral polyps    no trabeculation  - Ureteral orifices were normal in position and appearance.  Post-Procedure: - Patient tolerated the procedure well  Assessment/ Plan: Voiding symptoms and dysuria resolved with Gemtesa.  They have not returned since she completed her 1 month sample trial Main complaint is dyspareunia.  She has a GYN follow-up next month Trial amitriptyline 10 mg at bedtime.  If no improvement and no side effects she will call back in 2 weeks for dose titration    , MD

## 2021-12-20 ENCOUNTER — Encounter: Payer: Self-pay | Admitting: Urology

## 2021-12-23 ENCOUNTER — Ambulatory Visit
Admission: RE | Admit: 2021-12-23 | Discharge: 2021-12-23 | Disposition: A | Discharge status: Home / Self Care | Payer: 59 | Source: Ambulatory Visit | Attending: Obstetrics and Gynecology | Admitting: Obstetrics and Gynecology

## 2021-12-23 DIAGNOSIS — R42 Dizziness and giddiness: Secondary | ICD-10-CM | POA: Diagnosis not present

## 2021-12-23 DIAGNOSIS — E7800 Pure hypercholesterolemia, unspecified: Secondary | ICD-10-CM | POA: Diagnosis not present

## 2021-12-23 DIAGNOSIS — Z1231 Encounter for screening mammogram for malignant neoplasm of breast: Secondary | ICD-10-CM | POA: Diagnosis not present

## 2021-12-23 DIAGNOSIS — I10 Essential (primary) hypertension: Secondary | ICD-10-CM | POA: Diagnosis not present

## 2021-12-23 DIAGNOSIS — Z23 Encounter for immunization: Secondary | ICD-10-CM | POA: Diagnosis not present

## 2022-01-08 DIAGNOSIS — Z1331 Encounter for screening for depression: Secondary | ICD-10-CM | POA: Diagnosis not present

## 2022-01-08 DIAGNOSIS — Z01419 Encounter for gynecological examination (general) (routine) without abnormal findings: Secondary | ICD-10-CM | POA: Diagnosis not present

## 2022-01-08 DIAGNOSIS — Z1272 Encounter for screening for malignant neoplasm of vagina: Secondary | ICD-10-CM | POA: Diagnosis not present

## 2022-01-08 DIAGNOSIS — N941 Unspecified dyspareunia: Secondary | ICD-10-CM | POA: Diagnosis not present

## 2022-01-08 DIAGNOSIS — N898 Other specified noninflammatory disorders of vagina: Secondary | ICD-10-CM | POA: Diagnosis not present

## 2022-01-10 ENCOUNTER — Other Ambulatory Visit: Payer: Self-pay | Admitting: Urology

## 2022-01-19 DIAGNOSIS — R001 Bradycardia, unspecified: Secondary | ICD-10-CM | POA: Diagnosis not present

## 2022-01-19 DIAGNOSIS — R42 Dizziness and giddiness: Secondary | ICD-10-CM | POA: Diagnosis not present

## 2022-01-19 DIAGNOSIS — Z23 Encounter for immunization: Secondary | ICD-10-CM | POA: Diagnosis not present

## 2022-01-19 DIAGNOSIS — I10 Essential (primary) hypertension: Secondary | ICD-10-CM | POA: Diagnosis not present

## 2022-01-19 DIAGNOSIS — R531 Weakness: Secondary | ICD-10-CM | POA: Diagnosis not present

## 2022-01-19 DIAGNOSIS — R002 Palpitations: Secondary | ICD-10-CM | POA: Diagnosis not present

## 2022-02-22 DIAGNOSIS — R002 Palpitations: Secondary | ICD-10-CM | POA: Diagnosis not present

## 2022-02-24 DIAGNOSIS — R001 Bradycardia, unspecified: Secondary | ICD-10-CM | POA: Diagnosis not present

## 2022-02-24 DIAGNOSIS — R002 Palpitations: Secondary | ICD-10-CM | POA: Diagnosis not present

## 2022-02-24 DIAGNOSIS — I10 Essential (primary) hypertension: Secondary | ICD-10-CM | POA: Diagnosis not present

## 2022-02-24 DIAGNOSIS — R42 Dizziness and giddiness: Secondary | ICD-10-CM | POA: Diagnosis not present

## 2022-02-24 DIAGNOSIS — R531 Weakness: Secondary | ICD-10-CM | POA: Diagnosis not present

## 2022-02-24 DIAGNOSIS — Z23 Encounter for immunization: Secondary | ICD-10-CM | POA: Diagnosis not present

## 2022-03-11 DIAGNOSIS — M791 Myalgia, unspecified site: Secondary | ICD-10-CM | POA: Diagnosis not present

## 2022-07-12 DIAGNOSIS — Z0000 Encounter for general adult medical examination without abnormal findings: Secondary | ICD-10-CM | POA: Diagnosis not present

## 2022-11-09 ENCOUNTER — Other Ambulatory Visit: Payer: Self-pay | Admitting: Obstetrics and Gynecology

## 2022-11-09 DIAGNOSIS — Z1231 Encounter for screening mammogram for malignant neoplasm of breast: Secondary | ICD-10-CM

## 2022-11-16 DIAGNOSIS — Z23 Encounter for immunization: Secondary | ICD-10-CM | POA: Diagnosis not present

## 2022-12-22 DIAGNOSIS — Z1211 Encounter for screening for malignant neoplasm of colon: Secondary | ICD-10-CM | POA: Diagnosis not present

## 2022-12-22 DIAGNOSIS — R194 Change in bowel habit: Secondary | ICD-10-CM | POA: Diagnosis not present

## 2022-12-22 DIAGNOSIS — K219 Gastro-esophageal reflux disease without esophagitis: Secondary | ICD-10-CM | POA: Diagnosis not present

## 2022-12-24 DIAGNOSIS — U071 COVID-19: Secondary | ICD-10-CM | POA: Diagnosis not present

## 2023-01-10 ENCOUNTER — Ambulatory Visit
Admission: RE | Admit: 2023-01-10 | Discharge: 2023-01-10 | Disposition: A | Discharge status: Home / Self Care | Payer: 59 | Source: Ambulatory Visit | Attending: Obstetrics and Gynecology | Admitting: Obstetrics and Gynecology

## 2023-01-10 DIAGNOSIS — Z1231 Encounter for screening mammogram for malignant neoplasm of breast: Secondary | ICD-10-CM | POA: Diagnosis not present

## 2023-01-17 DIAGNOSIS — Z23 Encounter for immunization: Secondary | ICD-10-CM | POA: Diagnosis not present

## 2023-02-01 DIAGNOSIS — Z1331 Encounter for screening for depression: Secondary | ICD-10-CM | POA: Diagnosis not present

## 2023-02-01 DIAGNOSIS — Z01419 Encounter for gynecological examination (general) (routine) without abnormal findings: Secondary | ICD-10-CM | POA: Diagnosis not present

## 2023-02-17 DIAGNOSIS — J029 Acute pharyngitis, unspecified: Secondary | ICD-10-CM | POA: Diagnosis not present

## 2023-05-19 ENCOUNTER — Encounter: Payer: Self-pay | Admitting: Gastroenterology

## 2023-05-30 ENCOUNTER — Ambulatory Visit: Payer: 59 | Admitting: Anesthesiology

## 2023-05-30 ENCOUNTER — Encounter: Payer: Self-pay | Admitting: Gastroenterology

## 2023-05-30 ENCOUNTER — Ambulatory Visit
Admission: RE | Admit: 2023-05-30 | Discharge: 2023-05-30 | Disposition: A | Discharge status: Home / Self Care | Payer: 59 | Source: Ambulatory Visit | Attending: Gastroenterology | Admitting: Gastroenterology

## 2023-05-30 ENCOUNTER — Other Ambulatory Visit: Payer: Self-pay | Admitting: Gastroenterology

## 2023-05-30 ENCOUNTER — Encounter
Admission: RE | Disposition: A | Discharge status: Home / Self Care | Payer: Self-pay | Source: Ambulatory Visit | Attending: Gastroenterology

## 2023-05-30 DIAGNOSIS — Z1211 Encounter for screening for malignant neoplasm of colon: Secondary | ICD-10-CM | POA: Insufficient documentation

## 2023-05-30 DIAGNOSIS — K219 Gastro-esophageal reflux disease without esophagitis: Secondary | ICD-10-CM | POA: Diagnosis not present

## 2023-05-30 DIAGNOSIS — K635 Polyp of colon: Secondary | ICD-10-CM | POA: Insufficient documentation

## 2023-05-30 DIAGNOSIS — Z79899 Other long term (current) drug therapy: Secondary | ICD-10-CM | POA: Insufficient documentation

## 2023-05-30 DIAGNOSIS — D128 Benign neoplasm of rectum: Secondary | ICD-10-CM | POA: Diagnosis not present

## 2023-05-30 DIAGNOSIS — I10 Essential (primary) hypertension: Secondary | ICD-10-CM | POA: Insufficient documentation

## 2023-05-30 HISTORY — PX: POLYPECTOMY: SHX5525

## 2023-05-30 HISTORY — PX: COLONOSCOPY WITH PROPOFOL: SHX5780

## 2023-05-30 SURGERY — COLONOSCOPY WITH PROPOFOL
Anesthesia: General

## 2023-05-30 MED ORDER — SODIUM CHLORIDE 0.9 % IV SOLN
INTRAVENOUS | Status: DC
  Administered 2023-05-30: 20 mL/h via INTRAVENOUS

## 2023-05-30 MED ORDER — PROPOFOL 10 MG/ML IV BOLUS
INTRAVENOUS | Status: DC | PRN
  Administered 2023-05-30: 50 mg via INTRAVENOUS

## 2023-05-30 MED ORDER — PROPOFOL 1000 MG/100ML IV EMUL
INTRAVENOUS | Status: AC
  Filled 2023-05-30: qty 100

## 2023-05-30 MED ORDER — PROPOFOL 500 MG/50ML IV EMUL
INTRAVENOUS | Status: DC | PRN
  Administered 2023-05-30: 150 ug/kg/min via INTRAVENOUS

## 2023-05-30 NOTE — H&P (Signed)
 Pre-Procedure H&P   Patient ID: Karen Maldonado is a 63 y.o. female.  Gastroenterology Provider: Jaynie Collins, DO  Referring Provider: Tawni Pummel, PA PCP: Gracelyn Nurse, MD  Date: 05/30/2023  HPI Ms. Karen Maldonado is a 63 y.o. female who presents today for Colonoscopy for colorectal cancer screening .  Pt reports drinking 1.5 bottles of suprep, but BM is reportedly clear liquid  Patient has had colonoscopy last performed in May 2013 which was normal with a normal terminal ileum.  Also underwent colonoscopy in 2010 and 2001  No family history of colon cancer or colon polyps  Status post hysterectomy and cholecystectomy  Hemoglobin 14.1 MCV 92 platelets 267,000   Past Medical History:  Diagnosis Date   Allergic rhinitis 07/05/2011   COVID-19 virus infection 12/2018   DDD (degenerative disc disease), cervical 05/09/2012   Deafness, sensorineural 01/05/2012   Dizziness 06/24/2011   Overview:  negative ENT work-up Negative cardiac work-up including Holter, ECHO and stress test    Essential (primary) hypertension 02/04/2014   Fibromuscular dysplasia of cervicocranial artery (HCC)    Gastro-esophageal reflux disease without esophagitis 11/19/2013   GERD (gastroesophageal reflux disease)    Goiter    Hematuria    microscopic   Infection of the upper respiratory tract 07/05/2011   Near syncope 11/02/2013   Prediabetes     Past Surgical History:  Procedure Laterality Date   CHOLECYSTECTOMY     COLONOSCOPY  2013   ESOPHAGOGASTRODUODENOSCOPY     10/24/2003, 03/18/2009, 09/06/2013   TOTAL ABDOMINAL HYSTERECTOMY  2006   [facility] for fibroids Dr. Luella Cook    Family History No h/o GI disease or malignancy  Review of Systems  Constitutional:  Negative for activity change, appetite change, chills, diaphoresis, fatigue, fever and unexpected weight change.  HENT:  Negative for trouble swallowing and voice change.   Respiratory:  Negative for shortness of breath and  wheezing.   Cardiovascular:  Negative for chest pain, palpitations and leg swelling.  Gastrointestinal:  Negative for abdominal distention, abdominal pain, anal bleeding, blood in stool, constipation, diarrhea, nausea, rectal pain and vomiting.  Musculoskeletal:  Negative for arthralgias and myalgias.  Skin:  Negative for color change and pallor.  Neurological:  Negative for dizziness, syncope and weakness.  Psychiatric/Behavioral:  Negative for confusion.   All other systems reviewed and are negative.    Medications No current facility-administered medications on file prior to encounter.   Current Outpatient Medications on File Prior to Encounter  Medication Sig Dispense Refill   amitriptyline (ELAVIL) 10 MG tablet TAKE 1 TABLET BY MOUTH EVERYDAY AT BEDTIME 90 tablet 1   ascorbic acid (VITAMIN C) 500 MG tablet Take 500 mg by mouth daily.     aspirin EC 81 MG tablet Take by mouth.     losartan (COZAAR) 25 MG tablet Take 25 mg by mouth daily.     Multiple Vitamins-Minerals (MULTIVITAMIN WOMEN PO) Take 1 tablet by mouth daily at 2 PM.     pantoprazole (PROTONIX) 40 MG tablet      Vibegron (GEMTESA) 75 MG TABS Take 75 mg by mouth daily. 30 tablet 0    Pertinent medications related to GI and procedure were reviewed by me with the patient prior to the procedure   Current Facility-Administered Medications:    0.9 %  sodium chloride infusion, , Intravenous, Continuous, Jaynie Collins, DO, Last Rate: 20 mL/hr at 05/30/23 0934, 20 mL/hr at 05/30/23 0934  sodium chloride 20 mL/hr (05/30/23 0934)  Allergies  Allergen Reactions   Penicillin G Hives   Sulfa Antibiotics Nausea And Vomiting and Rash   Allergies were reviewed by me prior to the procedure  Objective   Body mass index is 23.52 kg/m. Vitals:   05/30/23 0923  BP: (!) 146/78  Pulse: 82  Resp: 16  Temp: (!) 96.7 F (35.9 C)  TempSrc: Temporal  SpO2: 100%  Weight: 58.3 kg  Height: 5\' 2"  (1.575 m)      Physical Exam Vitals and nursing note reviewed.  Constitutional:      General: She is not in acute distress.    Appearance: Normal appearance. She is not ill-appearing, toxic-appearing or diaphoretic.  HENT:     Head: Normocephalic and atraumatic.     Nose: Nose normal.     Mouth/Throat:     Mouth: Mucous membranes are moist.     Pharynx: Oropharynx is clear.  Eyes:     General: No scleral icterus.    Extraocular Movements: Extraocular movements intact.  Cardiovascular:     Rate and Rhythm: Normal rate and regular rhythm.     Heart sounds: Normal heart sounds. No murmur heard.    No friction rub. No gallop.  Pulmonary:     Effort: Pulmonary effort is normal. No respiratory distress.     Breath sounds: Normal breath sounds. No wheezing, rhonchi or rales.  Abdominal:     General: Bowel sounds are normal. There is no distension.     Palpations: Abdomen is soft.     Tenderness: There is no abdominal tenderness. There is no guarding or rebound.  Musculoskeletal:     Cervical back: Neck supple.     Right lower leg: No edema.     Left lower leg: No edema.  Skin:    General: Skin is warm and dry.     Coloration: Skin is not jaundiced or pale.  Neurological:     General: No focal deficit present.     Mental Status: She is alert and oriented to person, place, and time. Mental status is at baseline.  Psychiatric:        Mood and Affect: Mood normal.        Behavior: Behavior normal.        Thought Content: Thought content normal.        Judgment: Judgment normal.      Assessment:  Ms. Karen Maldonado is a 63 y.o. female  who presents today for Colonoscopy for colorectal cancer screening .  Plan:  Colonoscopy with possible intervention today  Colonoscopy with possible biopsy, control of bleeding, polypectomy, and interventions as necessary has been discussed with the patient/patient representative. Informed consent was obtained from the patient/patient representative  after explaining the indication, nature, and risks of the procedure including but not limited to death, bleeding, perforation, missed neoplasm/lesions, cardiorespiratory compromise, and reaction to medications. Opportunity for questions was given and appropriate answers were provided. Patient/patient representative has verbalized understanding is amenable to undergoing the procedure.   Jaynie Collins, DO  [facility]  Portions of the record may have been created with voice recognition software. Occasional wrong-word or 'sound-a-like' substitutions may have occurred due to the inherent limitations of voice recognition software.  Read the chart carefully and recognize, using context, where substitutions may have occurred.

## 2023-05-30 NOTE — Anesthesia Preprocedure Evaluation (Signed)
 Anesthesia Evaluation  Patient identified by MRN, date of birth, ID band Patient awake    Reviewed: Allergy & Precautions, H&P , NPO status , Patient's Chart, lab work & pertinent test results, reviewed documented beta blocker date and time   Airway Mallampati: III   Neck ROM: full    Dental  (+) Poor Dentition   Pulmonary neg pulmonary ROS   Pulmonary exam normal        Cardiovascular Exercise Tolerance: Poor hypertension, On Medications negative cardio ROS Normal cardiovascular exam Rhythm:regular Rate:Normal     Neuro/Psych negative neurological ROS  negative psych ROS   GI/Hepatic Neg liver ROS,GERD  Medicated,,  Endo/Other  negative endocrine ROS    Renal/GU negative Renal ROS  negative genitourinary   Musculoskeletal   Abdominal   Peds  Hematology negative hematology ROS (+)   Anesthesia Other Findings Past Medical History: 07/05/2011: Allergic rhinitis 12/2018: COVID-19 virus infection 05/09/2012: DDD (degenerative disc disease), cervical 01/05/2012: Deafness, sensorineural 06/24/2011: Dizziness     Comment:  Overview:  negative ENT work-up Negative cardiac work-up              including Holter, ECHO and stress test  02/04/2014: Essential (primary) hypertension No date: Fibromuscular dysplasia of cervicocranial artery (HCC) 11/19/2013: Gastro-esophageal reflux disease without esophagitis No date: GERD (gastroesophageal reflux disease) No date: Goiter No date: Hematuria     Comment:  microscopic 07/05/2011: Infection of the upper respiratory tract 11/02/2013: Near syncope No date: Prediabetes Past Surgical History: No date: CHOLECYSTECTOMY 2013: COLONOSCOPY No date: ESOPHAGOGASTRODUODENOSCOPY     Comment:  10/24/2003, 03/18/2009, 09/06/2013 2006: TOTAL ABDOMINAL HYSTERECTOMY     Comment:  [facility] for fibroids Dr. Luella Cook BMI    Body Mass Index: 23.52 kg/m     Reproductive/Obstetrics negative OB ROS                              Anesthesia Physical Anesthesia Plan  ASA: 3  Anesthesia Plan: General   Post-op Pain Management:    Induction:   PONV Risk Score and Plan:   Airway Management Planned:   Additional Equipment:   Intra-op Plan:   Post-operative Plan:   Informed Consent: I have reviewed the patients History and Physical, chart, labs and discussed the procedure including the risks, benefits and alternatives for the proposed anesthesia with the patient or authorized representative who has indicated his/her understanding and acceptance.     Dental Advisory Given  Plan Discussed with: CRNA  Anesthesia Plan Comments:        Anesthesia Quick Evaluation

## 2023-05-30 NOTE — Op Note (Signed)
 [facility] Patient Name: Karen Maldonado Procedure Date: 05/30/2023 10:05 AM MRN: [id number] Account #: [id number] Date of Birth: [birth year] Admit Type: Outpatient Age: 63 Room: [facility] ENDO ROOM 2 Gender: Female Note Status: Finalized Instrument Name: Peds Colonoscope [id number] Procedure:             Colonoscopy Indications:           Screening for colorectal malignant neoplasm Providers:             Trenda Moots, DO Referring MD:          Gracelyn Nurse, MD (Referring MD) Medicines:             Monitored Anesthesia Care Complications:         No immediate complications. Estimated blood loss:                         Minimal. Procedure:             Pre-Anesthesia Assessment:                        - Prior to the procedure, a History and Physical was                         performed, and patient medications and allergies were                         reviewed. The patient is competent. The risks and                         benefits of the procedure and the sedation options and                         risks were discussed with the patient. All questions                         were answered and informed consent was obtained.                         Patient identification and proposed procedure were                         verified by the physician, the nurse, the anesthetist                         and the technician in the endoscopy suite. Mental                         Status Examination: alert and oriented. Airway                         Examination: normal oropharyngeal airway and neck                         mobility. Respiratory Examination: clear to                         auscultation. CV Examination: RRR, no murmurs, no S3  or S4. Prophylactic Antibiotics: The patient does not                         require prophylactic antibiotics. Prior                         Anticoagulants: The patient has taken no  anticoagulant                         or antiplatelet agents. ASA Grade Assessment: III - A                         patient with severe systemic disease. After reviewing                         the risks and benefits, the patient was deemed in                         satisfactory condition to undergo the procedure. The                         anesthesia plan was to use monitored anesthesia care                         (MAC). Immediately prior to administration of                         medications, the patient was re-assessed for adequacy                         to receive sedatives. The heart rate, respiratory                         rate, oxygen saturations, blood pressure, adequacy of                         pulmonary ventilation, and response to care were                         monitored throughout the procedure. The physical                         status of the patient was re-assessed after the                         procedure.                        After obtaining informed consent, the colonoscope was                         passed under direct vision. Throughout the procedure,                         the patient's blood pressure, pulse, and oxygen                         saturations were monitored continuously. The  Colonoscope was introduced through the anus and                         advanced to the the cecum, identified by appendiceal                         orifice and ileocecal valve. The colonoscopy was                         performed without difficulty. The patient tolerated                         the procedure well. The quality of the bowel                         preparation was evaluated using the BBPS [facility] Bowel                         Preparation Scale) with scores of: Right Colon = 2                         (minor amount of residual staining, small fragments of                         stool and/or opaque liquid, but mucosa seen well),                          Transverse Colon = 2 (minor amount of residual                         staining, small fragments of stool and/or opaque                         liquid, but mucosa seen well) and Left Colon = 2                         (minor amount of residual staining, small fragments of                         stool and/or opaque liquid, but mucosa seen well). The                         total BBPS score equals 6. The quality of the bowel                         preparation was good. The ileocecal valve, appendiceal                         orifice, and rectum were photographed. Findings:      The perianal and digital rectal examinations were normal. Pertinent       negatives include normal sphincter tone.      Two sessile polyps were found in the rectum and cecum. The polyps were 1       to 2 mm in size. These polyps were removed with a jumbo cold forceps.       Resection and retrieval were complete. Estimated blood loss was minimal.  A moderate amount of liquid stool was found in the entire colon,       interfering with visualization. Lavage of the area was performed using a       large amount, resulting in clearance with fair visualization. Estimated       blood loss: none.      The exam was otherwise without abnormality on direct and retroflexion       views. Impression:            - Two 1 to 2 mm polyps in the rectum and in the cecum,                         removed with a jumbo cold forceps. Resected and                         retrieved.                        - Stool in the entire examined colon.                        - The examination was otherwise normal on direct and                         retroflexion views. Recommendation:        - Patient has a contact number available for                         emergencies. The signs and symptoms of potential                         delayed complications were discussed with the patient.                         Return to  normal activities tomorrow. Written                         discharge instructions were provided to the patient.                        - Discharge patient to home.                        - Resume previous diet.                        - Continue present medications.                        - Await pathology results.                        - Repeat colonoscopy for surveillance based on                         pathology results.                        - Return to referring physician as previously  scheduled.                        - The findings and recommendations were discussed with                         the patient.                        - The findings and recommendations were discussed with                         the patient's family. Procedure Code(s):     --- Professional ---                        [phone], Colonoscopy, flexible; with biopsy, single or                         multiple Diagnosis Code(s):     --- Professional ---                        Z12.11, Encounter for screening for malignant neoplasm                         of colon                        D12.8, Benign neoplasm of rectum                        D12.0, Benign neoplasm of cecum CPT copyright 2022 [employer]. All rights reserved. The codes documented in this report are preliminary and upon coder review may  be revised to meet current compliance requirements. Attending Participation:      I personally performed the entire procedure. Elfredia Nevins, DO Jaynie Collins DO, DO 05/30/2023 10:51:02 AM This report has been signed electronically. Number of Addenda: 0 Note Initiated On: 05/30/2023 10:05 AM Scope Withdrawal Time: 0 hours 15 minutes 5 seconds  Total Procedure Duration: 0 hours 22 minutes 54 seconds  Estimated Blood Loss:  Estimated blood loss was minimal.      [facility]

## 2023-05-30 NOTE — Transfer of Care (Signed)
 Immediate Anesthesia Transfer of Care Note  Patient: Karen Maldonado  Procedure(s) Performed: COLONOSCOPY WITH PROPOFOL POLYPECTOMY  Patient Location: PACU  Anesthesia Type:MAC and General  Level of Consciousness: awake and sedated  Airway & Oxygen Therapy: Patient Spontanous Breathing and Patient connected to nasal cannula oxygen  Post-op Assessment: Report given to RN and Post -op Vital signs reviewed and stable  Post vital signs: Reviewed and stable  Last Vitals:  Vitals Value Taken Time  BP    Temp    Pulse    Resp    SpO2      Last Pain:  Vitals:   05/30/23 0923  TempSrc: Temporal  PainSc: 1          Complications: There were no known notable events for this encounter.

## 2023-05-30 NOTE — Interval H&P Note (Signed)
 History and Physical Interval Note: Preprocedure H&P from 05/30/23  was reviewed and there was no interval change after seeing and examining the patient.  Written consent was obtained from the patient after discussion of risks, benefits, and alternatives. Patient has consented to proceed with Colonoscopy with possible intervention   05/30/2023 10:11 AM  Karen Maldonado  has presented today for surgery, with the diagnosis of Z12.11  - Colon cancer screening.  The various methods of treatment have been discussed with the patient and family. After consideration of risks, benefits and other options for treatment, the patient has consented to  Procedure(s): COLONOSCOPY WITH PROPOFOL (N/A) as a surgical intervention.  The patient's history has been reviewed, patient examined, no change in status, stable for surgery.  I have reviewed the patient's chart and labs.  Questions were answered to the patient's satisfaction.     Jaynie Collins

## 2023-05-31 ENCOUNTER — Encounter: Payer: Self-pay | Admitting: Gastroenterology

## 2023-05-31 NOTE — Anesthesia Postprocedure Evaluation (Signed)
 Anesthesia Post Note  Patient: Karen Maldonado  Procedure(s) Performed: COLONOSCOPY WITH PROPOFOL POLYPECTOMY  Patient location during evaluation: PACU Anesthesia Type: General Level of consciousness: awake and alert Pain management: pain level controlled Vital Signs Assessment: post-procedure vital signs reviewed and stable Respiratory status: spontaneous breathing, nonlabored ventilation, respiratory function stable and patient connected to nasal cannula oxygen Cardiovascular status: blood pressure returned to baseline and stable Postop Assessment: no apparent nausea or vomiting Anesthetic complications: no   There were no known notable events for this encounter.   Last Vitals:  Vitals:   05/30/23 1051 05/30/23 1107  BP:  122/85  Pulse:  74  Resp:    Temp: (!) 36.1 C   SpO2:  100%    Last Pain:  Vitals:   05/31/23 0807  TempSrc:   PainSc: 0-No pain                 Yevette Edwards

## 2023-06-01 LAB — SURGICAL PATHOLOGY

## 2023-07-14 DIAGNOSIS — R7303 Prediabetes: Secondary | ICD-10-CM | POA: Diagnosis not present

## 2023-07-25 DIAGNOSIS — K219 Gastro-esophageal reflux disease without esophagitis: Secondary | ICD-10-CM | POA: Diagnosis not present

## 2023-07-25 DIAGNOSIS — I10 Essential (primary) hypertension: Secondary | ICD-10-CM | POA: Diagnosis not present

## 2023-07-25 DIAGNOSIS — Z1331 Encounter for screening for depression: Secondary | ICD-10-CM | POA: Diagnosis not present

## 2023-07-25 DIAGNOSIS — Z0001 Encounter for general adult medical examination with abnormal findings: Secondary | ICD-10-CM | POA: Diagnosis not present

## 2023-07-25 DIAGNOSIS — R7303 Prediabetes: Secondary | ICD-10-CM | POA: Diagnosis not present

## 2023-07-25 DIAGNOSIS — Z1339 Encounter for screening examination for other mental health and behavioral disorders: Secondary | ICD-10-CM | POA: Diagnosis not present

## 2023-07-25 DIAGNOSIS — E7800 Pure hypercholesterolemia, unspecified: Secondary | ICD-10-CM | POA: Diagnosis not present

## 2023-11-28 ENCOUNTER — Other Ambulatory Visit: Payer: Self-pay | Admitting: Obstetrics and Gynecology

## 2023-11-28 DIAGNOSIS — Z1231 Encounter for screening mammogram for malignant neoplasm of breast: Secondary | ICD-10-CM

## 2024-01-04 ENCOUNTER — Other Ambulatory Visit: Payer: Self-pay | Admitting: Medical Genetics

## 2024-01-09 ENCOUNTER — Other Ambulatory Visit

## 2024-01-11 ENCOUNTER — Ambulatory Visit
Admission: RE | Admit: 2024-01-11 | Discharge: 2024-01-11 | Disposition: A | Discharge status: Home / Self Care | Source: Ambulatory Visit | Attending: Obstetrics and Gynecology | Admitting: Obstetrics and Gynecology

## 2024-01-11 DIAGNOSIS — Z1231 Encounter for screening mammogram for malignant neoplasm of breast: Secondary | ICD-10-CM | POA: Insufficient documentation
